# Patient Record
Sex: Female | Born: 1945 | Race: White | Hispanic: No | Marital: Married | Smoking: Never smoker
Health system: Southern US, Community
[De-identification: ages and names within clinical notes are randomized; demographics above are authoritative.]

## PROBLEM LIST (undated history)

## (undated) DIAGNOSIS — N952 Postmenopausal atrophic vaginitis: Secondary | ICD-10-CM

## (undated) DIAGNOSIS — C801 Malignant (primary) neoplasm, unspecified: Secondary | ICD-10-CM

## (undated) DIAGNOSIS — I3139 Other pericardial effusion (noninflammatory): Secondary | ICD-10-CM

## (undated) DIAGNOSIS — N816 Rectocele: Secondary | ICD-10-CM

## (undated) DIAGNOSIS — R32 Unspecified urinary incontinence: Secondary | ICD-10-CM

## (undated) DIAGNOSIS — Z85828 Personal history of other malignant neoplasm of skin: Secondary | ICD-10-CM

## (undated) DIAGNOSIS — M810 Age-related osteoporosis without current pathological fracture: Secondary | ICD-10-CM

## (undated) DIAGNOSIS — F329 Major depressive disorder, single episode, unspecified: Secondary | ICD-10-CM

## (undated) DIAGNOSIS — E039 Hypothyroidism, unspecified: Secondary | ICD-10-CM

## (undated) DIAGNOSIS — IMO0002 Reserved for concepts with insufficient information to code with codable children: Secondary | ICD-10-CM

## (undated) DIAGNOSIS — I10 Essential (primary) hypertension: Secondary | ICD-10-CM

## (undated) DIAGNOSIS — E559 Vitamin D deficiency, unspecified: Secondary | ICD-10-CM

## (undated) DIAGNOSIS — I313 Pericardial effusion (noninflammatory): Secondary | ICD-10-CM

## (undated) DIAGNOSIS — N761 Subacute and chronic vaginitis: Secondary | ICD-10-CM

## (undated) DIAGNOSIS — E785 Hyperlipidemia, unspecified: Secondary | ICD-10-CM

## (undated) DIAGNOSIS — Z8619 Personal history of other infectious and parasitic diseases: Secondary | ICD-10-CM

## (undated) DIAGNOSIS — S22000A Wedge compression fracture of unspecified thoracic vertebra, initial encounter for closed fracture: Secondary | ICD-10-CM

## (undated) DIAGNOSIS — M1990 Unspecified osteoarthritis, unspecified site: Secondary | ICD-10-CM

## (undated) DIAGNOSIS — F32A Depression, unspecified: Secondary | ICD-10-CM

## (undated) HISTORY — DX: Personal history of other malignant neoplasm of skin: Z85.828

## (undated) HISTORY — PX: VAGINAL HYSTERECTOMY: SUR661

## (undated) HISTORY — DX: Hypothyroidism, unspecified: E03.9

## (undated) HISTORY — DX: Other pericardial effusion (noninflammatory): I31.39

## (undated) HISTORY — DX: Vitamin D deficiency, unspecified: E55.9

## (undated) HISTORY — DX: Reserved for concepts with insufficient information to code with codable children: IMO0002

## (undated) HISTORY — PX: EYE SURGERY: SHX253

## (undated) HISTORY — DX: Subacute and chronic vaginitis: N76.1

## (undated) HISTORY — DX: Age-related osteoporosis without current pathological fracture: M81.0

## (undated) HISTORY — DX: Depression, unspecified: F32.A

## (undated) HISTORY — DX: Wedge compression fracture of unspecified thoracic vertebra, initial encounter for closed fracture: S22.000A

## (undated) HISTORY — DX: Unspecified urinary incontinence: R32

## (undated) HISTORY — DX: Personal history of other infectious and parasitic diseases: Z86.19

## (undated) HISTORY — DX: Rectocele: N81.6

## (undated) HISTORY — PX: TONSILLECTOMY: SUR1361

## (undated) HISTORY — DX: Postmenopausal atrophic vaginitis: N95.2

## (undated) HISTORY — PX: APPENDECTOMY: SHX54

## (undated) HISTORY — DX: Major depressive disorder, single episode, unspecified: F32.9

## (undated) HISTORY — DX: Hyperlipidemia, unspecified: E78.5

## (undated) HISTORY — DX: Pericardial effusion (noninflammatory): I31.3

---

## 2001-10-06 HISTORY — PX: CHOLECYSTECTOMY: SHX55

## 2004-10-12 ENCOUNTER — Ambulatory Visit: Payer: Self-pay | Admitting: Internal Medicine

## 2005-01-23 ENCOUNTER — Ambulatory Visit: Payer: Self-pay | Admitting: Internal Medicine

## 2006-04-17 ENCOUNTER — Ambulatory Visit: Payer: Self-pay | Admitting: Internal Medicine

## 2006-05-18 ENCOUNTER — Ambulatory Visit: Payer: Self-pay | Admitting: Internal Medicine

## 2007-09-17 ENCOUNTER — Ambulatory Visit: Payer: Self-pay | Admitting: Internal Medicine

## 2008-02-18 ENCOUNTER — Ambulatory Visit: Payer: Self-pay | Admitting: Internal Medicine

## 2008-05-23 ENCOUNTER — Ambulatory Visit: Payer: Self-pay | Admitting: Internal Medicine

## 2009-02-28 ENCOUNTER — Ambulatory Visit: Payer: Self-pay | Admitting: Internal Medicine

## 2010-04-26 ENCOUNTER — Ambulatory Visit: Payer: Self-pay | Admitting: Internal Medicine

## 2011-02-05 ENCOUNTER — Ambulatory Visit: Payer: Self-pay | Admitting: Ophthalmology

## 2011-05-21 ENCOUNTER — Ambulatory Visit: Payer: Self-pay | Admitting: Ophthalmology

## 2011-10-17 ENCOUNTER — Ambulatory Visit: Payer: Self-pay | Admitting: Internal Medicine

## 2012-03-05 ENCOUNTER — Ambulatory Visit: Payer: Self-pay | Admitting: Internal Medicine

## 2012-11-26 ENCOUNTER — Ambulatory Visit: Payer: Self-pay | Admitting: Internal Medicine

## 2013-10-03 ENCOUNTER — Ambulatory Visit: Payer: Self-pay | Admitting: Internal Medicine

## 2013-12-05 ENCOUNTER — Ambulatory Visit: Payer: Self-pay | Admitting: Internal Medicine

## 2014-03-13 DIAGNOSIS — M810 Age-related osteoporosis without current pathological fracture: Secondary | ICD-10-CM | POA: Insufficient documentation

## 2014-03-13 DIAGNOSIS — E785 Hyperlipidemia, unspecified: Secondary | ICD-10-CM | POA: Insufficient documentation

## 2014-03-13 DIAGNOSIS — E871 Hypo-osmolality and hyponatremia: Secondary | ICD-10-CM | POA: Insufficient documentation

## 2014-03-13 DIAGNOSIS — K219 Gastro-esophageal reflux disease without esophagitis: Secondary | ICD-10-CM | POA: Insufficient documentation

## 2014-03-14 ENCOUNTER — Ambulatory Visit: Payer: Self-pay | Admitting: Internal Medicine

## 2014-06-28 ENCOUNTER — Ambulatory Visit: Payer: Self-pay | Admitting: Ophthalmology

## 2015-04-27 ENCOUNTER — Other Ambulatory Visit: Payer: Self-pay | Admitting: Internal Medicine

## 2015-04-27 DIAGNOSIS — Z1231 Encounter for screening mammogram for malignant neoplasm of breast: Secondary | ICD-10-CM

## 2015-04-30 ENCOUNTER — Other Ambulatory Visit: Payer: Self-pay | Admitting: Internal Medicine

## 2015-04-30 ENCOUNTER — Ambulatory Visit
Admission: RE | Admit: 2015-04-30 | Discharge: 2015-04-30 | Disposition: A | Discharge status: Home / Self Care | Payer: Medicare Other | Source: Ambulatory Visit | Attending: Internal Medicine | Admitting: Internal Medicine

## 2015-04-30 DIAGNOSIS — Z1231 Encounter for screening mammogram for malignant neoplasm of breast: Secondary | ICD-10-CM

## 2015-05-14 ENCOUNTER — Telehealth: Payer: Self-pay | Admitting: Obstetrics and Gynecology

## 2015-05-14 ENCOUNTER — Other Ambulatory Visit: Payer: Self-pay | Admitting: Obstetrics and Gynecology

## 2015-05-14 MED ORDER — ESTROGENS, CONJUGATED 0.625 MG/GM VA CREA: 1.0000 | TOPICAL_CREAM | VAGINAL | Status: DC

## 2015-05-14 NOTE — Telephone Encounter (Signed)
OK SO Meagan Cain DECIDED TO STAY ON DR DE SCHEDULE AND IS COMING 8/25 @ 730. SHE NEEDS A REFILL ON PREMARIN CREAM UNTIL THEN PLEASE.

## 2015-05-14 NOTE — Telephone Encounter (Signed)
Pt aware 1 tube erx.

## 2015-05-31 ENCOUNTER — Ambulatory Visit (INDEPENDENT_AMBULATORY_CARE_PROVIDER_SITE_OTHER): Payer: Medicare Other | Admitting: Obstetrics and Gynecology

## 2015-05-31 ENCOUNTER — Encounter: Payer: Self-pay | Admitting: Obstetrics and Gynecology

## 2015-05-31 VITALS — BP 156/81 | HR 73 | Ht 61.0 in | Wt 143.2 lb

## 2015-05-31 DIAGNOSIS — E038 Other specified hypothyroidism: Secondary | ICD-10-CM | POA: Diagnosis not present

## 2015-05-31 DIAGNOSIS — H6520 Chronic serous otitis media, unspecified ear: Secondary | ICD-10-CM

## 2015-05-31 DIAGNOSIS — Z9889 Other specified postprocedural states: Secondary | ICD-10-CM | POA: Diagnosis not present

## 2015-05-31 DIAGNOSIS — E559 Vitamin D deficiency, unspecified: Secondary | ICD-10-CM

## 2015-05-31 DIAGNOSIS — N762 Acute vulvitis: Secondary | ICD-10-CM | POA: Diagnosis not present

## 2015-05-31 DIAGNOSIS — Z90710 Acquired absence of both cervix and uterus: Secondary | ICD-10-CM | POA: Diagnosis not present

## 2015-05-31 DIAGNOSIS — E079 Disorder of thyroid, unspecified: Secondary | ICD-10-CM | POA: Insufficient documentation

## 2015-05-31 DIAGNOSIS — IMO0002 Reserved for concepts with insufficient information to code with codable children: Secondary | ICD-10-CM

## 2015-05-31 DIAGNOSIS — Z85828 Personal history of other malignant neoplasm of skin: Secondary | ICD-10-CM

## 2015-05-31 DIAGNOSIS — S22000A Wedge compression fracture of unspecified thoracic vertebra, initial encounter for closed fracture: Secondary | ICD-10-CM | POA: Insufficient documentation

## 2015-05-31 DIAGNOSIS — N8111 Cystocele, midline: Secondary | ICD-10-CM | POA: Insufficient documentation

## 2015-05-31 DIAGNOSIS — N816 Rectocele: Secondary | ICD-10-CM

## 2015-05-31 DIAGNOSIS — S22000S Wedge compression fracture of unspecified thoracic vertebra, sequela: Secondary | ICD-10-CM

## 2015-05-31 DIAGNOSIS — H6690 Otitis media, unspecified, unspecified ear: Secondary | ICD-10-CM | POA: Insufficient documentation

## 2015-05-31 DIAGNOSIS — N8110 Cystocele, unspecified: Secondary | ICD-10-CM

## 2015-05-31 DIAGNOSIS — E039 Hypothyroidism, unspecified: Secondary | ICD-10-CM | POA: Insufficient documentation

## 2015-05-31 DIAGNOSIS — I10 Essential (primary) hypertension: Secondary | ICD-10-CM | POA: Insufficient documentation

## 2015-05-31 DIAGNOSIS — Z9049 Acquired absence of other specified parts of digestive tract: Secondary | ICD-10-CM | POA: Insufficient documentation

## 2015-05-31 DIAGNOSIS — F329 Major depressive disorder, single episode, unspecified: Secondary | ICD-10-CM | POA: Insufficient documentation

## 2015-05-31 DIAGNOSIS — Z8619 Personal history of other infectious and parasitic diseases: Secondary | ICD-10-CM

## 2015-05-31 DIAGNOSIS — K469 Unspecified abdominal hernia without obstruction or gangrene: Secondary | ICD-10-CM | POA: Diagnosis not present

## 2015-05-31 DIAGNOSIS — F32A Depression, unspecified: Secondary | ICD-10-CM | POA: Insufficient documentation

## 2015-05-31 DIAGNOSIS — N3946 Mixed incontinence: Secondary | ICD-10-CM | POA: Diagnosis not present

## 2015-05-31 DIAGNOSIS — R32 Unspecified urinary incontinence: Secondary | ICD-10-CM | POA: Insufficient documentation

## 2015-05-31 MED ORDER — NYSTATIN 100000 UNIT/GM EX OINT: 1.0000 "application " | TOPICAL_OINTMENT | Freq: Two times a day (BID) | CUTANEOUS | Status: DC

## 2015-05-31 MED ORDER — TRIAMCINOLONE ACETONIDE 0.1 % EX OINT: 1.0000 "application " | TOPICAL_OINTMENT | Freq: Two times a day (BID) | CUTANEOUS | Status: DC

## 2015-05-31 NOTE — Patient Instructions (Addendum)
1.  Continue Premarin cream intravaginally Twice weekly. 2.  Use the nystatin/triamcinolone Ointment twice a day for 2 weeks, apply topically to the vulva. 3.  Return in 2 weeks for pessary trial.

## 2015-05-31 NOTE — Progress Notes (Signed)
Chief complaint: 1.  Cystocele. 2.  Rectocele 3.  Difficulty voiding. 4.  External vulvar irritation.  The patient is a 69 year old female with known second to third-degree cystocele and mild rectocele, presents for interval follow-up.  She has not been seen in several years.  Patient is using Premarin cream intravaginally twice weekly. Most recently, patient has used Lotrisone cream externally for vulvar irritation.  Past Medical History  Diagnosis Date  . Thoracic compression fracture   . Effusion, pericardium   . Depression   . Vitamin D deficiency   . Hyperlipemia   . Hypothyroidism   . History of shingles   . History of skin cancer   . Osteoporosis   . Chronic vaginitis   . Urinary incontinence   . Vaginal atrophy   . Rectocele   . Cystocele    Past Surgical History  Procedure Laterality Date  . Cholecystectomy  2003  . Vaginal hysterectomy    . Appendectomy     Review of Systems  Constitutional: Negative.   HENT: Negative.   Respiratory: Negative.   Cardiovascular: Negative.   Gastrointestinal: Negative.   Genitourinary: Negative.   Musculoskeletal: Negative.   Skin: Positive for itching.       Vulvar itching and burning.  Improves with Lotrisone therapy intermittently  Endo/Heme/Allergies: Negative.    OBJECTIVE: BP 156/81 mmHg  Pulse 73  Ht 5\' 1"  (1.549 m)  Wt 143 lb 3 oz (64.949 kg)  BMI 27.07 kg/m2 Pleasant, well-appearing white female in no acute distress. Abdomen: Soft, nontender, without organomegaly. Pelvic exam: External genitalia-bilateral labia majora, erythema, without epithelial skin breakdown or ulceration; few possible satellite lesions BUS-normal. Vagina-third degree cystocele; vaginal mucosa with fair estrogen effect; enterocele present; mild rectocele. Cervix-surgically absent. Uterus-surgically absent Adnexa-nontender, nonpalpable. Rectovaginal-external exam with mild hyperemia; normal sphincter tone,; no rectal masses. Extremities:  Without clubbing, cyanosis or edema.  IMPRESSION: 1.  Third-degree cystocele with occasional difficulty emptying bladder when full; mild incontinence, intermittent. 2.  Mild rectocele, Minimally symptomatic. 3.  Enterocele. 4.  Chronic vulvitis incompletely clearing with Lotrisone therapy; possible monilia. 5.  Voiding difficulties.  PLAN: 1.  Options of management are reviewed regarding pelvic organ prolapse.  Conservative therapy with pessary versus definitive surgery with anterior, posterior colporrhaphy with enterocele ligation reviewed. 2.  Continue with Premarin cream, intravaginal biweekly. 3.  Nystatin and triamcinolone ointment to be applied topically twice a day for 14 days. 4.  Return in 2 weeks for follow-up on vulvitis and for a pessary trial.

## 2015-06-04 ENCOUNTER — Other Ambulatory Visit: Payer: Self-pay | Admitting: Internal Medicine

## 2015-06-04 DIAGNOSIS — M546 Pain in thoracic spine: Secondary | ICD-10-CM

## 2015-06-08 ENCOUNTER — Ambulatory Visit
Admission: RE | Admit: 2015-06-08 | Discharge: 2015-06-08 | Disposition: A | Discharge status: Home / Self Care | Payer: Medicare Other | Source: Ambulatory Visit | Attending: Internal Medicine | Admitting: Internal Medicine

## 2015-06-08 DIAGNOSIS — M546 Pain in thoracic spine: Secondary | ICD-10-CM

## 2015-06-08 DIAGNOSIS — G8929 Other chronic pain: Secondary | ICD-10-CM | POA: Insufficient documentation

## 2015-06-08 DIAGNOSIS — M4854XA Collapsed vertebra, not elsewhere classified, thoracic region, initial encounter for fracture: Secondary | ICD-10-CM | POA: Insufficient documentation

## 2015-06-08 DIAGNOSIS — M47814 Spondylosis without myelopathy or radiculopathy, thoracic region: Secondary | ICD-10-CM | POA: Diagnosis not present

## 2015-06-20 ENCOUNTER — Encounter: Payer: Self-pay | Admitting: Obstetrics and Gynecology

## 2015-06-20 ENCOUNTER — Ambulatory Visit (INDEPENDENT_AMBULATORY_CARE_PROVIDER_SITE_OTHER): Payer: Medicare Other | Admitting: Obstetrics and Gynecology

## 2015-06-20 VITALS — BP 139/78 | HR 75 | Ht 61.0 in | Wt 147.3 lb

## 2015-06-20 DIAGNOSIS — IMO0002 Reserved for concepts with insufficient information to code with codable children: Secondary | ICD-10-CM

## 2015-06-20 DIAGNOSIS — K469 Unspecified abdominal hernia without obstruction or gangrene: Secondary | ICD-10-CM | POA: Diagnosis not present

## 2015-06-20 DIAGNOSIS — N8110 Cystocele, unspecified: Secondary | ICD-10-CM | POA: Diagnosis not present

## 2015-06-20 DIAGNOSIS — N816 Rectocele: Secondary | ICD-10-CM | POA: Diagnosis not present

## 2015-06-20 DIAGNOSIS — Z90710 Acquired absence of both cervix and uterus: Secondary | ICD-10-CM | POA: Diagnosis not present

## 2015-06-20 NOTE — Patient Instructions (Signed)
1.  Pessaries are not effective in managing symptoms. 2.  Continue with vaginal estrogen therapy twice a week. 3.  Return for preoperative appointment for scheduled anterior/posterior colporrhaphy with enterocele ligation.

## 2015-06-20 NOTE — Progress Notes (Signed)
Patient ID: Meagan Cain, female   DOB: 11/13/1945, 69 y.o.   MRN: [id number] Pessary fitting  Chief complaint: 1.  Cystocele. 2.  Rectocele. 3.  Enterocele. 4.  Mixed incontinence.  Patient presents today for pessary fitting.  Multiple pessaries were attempted including: 1. Ring with support 2.  Incontinence ring 3.  Doughnut 4.  GellHorn-declined  After discussing options of management including a pessary, which would prevent intimacy, the patient has decided to proceed with surgical repair.  She understands that the repair is time-limited to 7-10 years, and that there is a possibility of developing PostoperativeStress incontinence.  The patient is willing to proceed with surgery and is accepting of risks.  She will return for a preoperative appointment prior to the surgery.  IMPRESSION: 1.  Symptomatic pelvic relaxation (third-degree cystocele, moderate rectocele, enterocele). 2.  Unsuccessful pessary fitting.  PLAN: 1.  Scheduled anterior/posterior colporrhaphy with enterocele ligation. 2.  Return for preoperative appointment.  A total of 25 minutes were spent face-to-face with the patient during this encounter and over half of that time involved counseling and coordination of care.  Brayton Mars, MD

## 2015-07-09 DIAGNOSIS — M79673 Pain in unspecified foot: Secondary | ICD-10-CM

## 2015-07-24 ENCOUNTER — Encounter: Payer: Self-pay | Admitting: Obstetrics and Gynecology

## 2015-07-24 ENCOUNTER — Ambulatory Visit (INDEPENDENT_AMBULATORY_CARE_PROVIDER_SITE_OTHER): Payer: Medicare Other | Admitting: Obstetrics and Gynecology

## 2015-07-24 ENCOUNTER — Encounter
Admission: RE | Admit: 2015-07-24 | Discharge: 2015-07-24 | Disposition: A | Discharge status: Home / Self Care | Payer: Medicare Other | Source: Ambulatory Visit | Attending: Obstetrics and Gynecology | Admitting: Obstetrics and Gynecology

## 2015-07-24 VITALS — BP 130/78 | HR 89 | Ht 61.0 in | Wt 143.0 lb

## 2015-07-24 DIAGNOSIS — Z90710 Acquired absence of both cervix and uterus: Secondary | ICD-10-CM

## 2015-07-24 DIAGNOSIS — K469 Unspecified abdominal hernia without obstruction or gangrene: Secondary | ICD-10-CM | POA: Diagnosis not present

## 2015-07-24 DIAGNOSIS — N816 Rectocele: Secondary | ICD-10-CM

## 2015-07-24 DIAGNOSIS — N8110 Cystocele, unspecified: Secondary | ICD-10-CM | POA: Diagnosis not present

## 2015-07-24 DIAGNOSIS — Z01818 Encounter for other preprocedural examination: Secondary | ICD-10-CM | POA: Diagnosis not present

## 2015-07-24 DIAGNOSIS — IMO0002 Reserved for concepts with insufficient information to code with codable children: Secondary | ICD-10-CM

## 2015-07-24 HISTORY — DX: Essential (primary) hypertension: I10

## 2015-07-24 HISTORY — DX: Unspecified osteoarthritis, unspecified site: M19.90

## 2015-07-24 HISTORY — DX: Malignant (primary) neoplasm, unspecified: C80.1

## 2015-07-24 LAB — RAPID HIV SCREEN (HIV 1/2 AB+AG)
HIV 1/2 ANTIBODIES: NONREACTIVE
HIV-1 P24 ANTIGEN - HIV24: NONREACTIVE

## 2015-07-24 LAB — CBC WITH DIFFERENTIAL/PLATELET
BASOS ABS: 0.1 10*3/uL (ref 0–0.1)
BASOS PCT: 1 %
EOS ABS: 0.1 10*3/uL (ref 0–0.7)
EOS PCT: 3 %
HCT: 37.1 % (ref 35.0–47.0)
Hemoglobin: 12.4 g/dL (ref 12.0–16.0)
Lymphocytes Relative: 37 %
Lymphs Abs: 2.1 10*3/uL (ref 1.0–3.6)
MCH: 29 pg (ref 26.0–34.0)
MCHC: 33.5 g/dL (ref 32.0–36.0)
MCV: 86.6 fL (ref 80.0–100.0)
Monocytes Absolute: 0.6 10*3/uL (ref 0.2–0.9)
Monocytes Relative: 11 %
NEUTROS PCT: 50 %
Neutro Abs: 2.9 10*3/uL (ref 1.4–6.5)
PLATELETS: 343 10*3/uL (ref 150–440)
RBC: 4.28 MIL/uL (ref 3.80–5.20)
RDW: 14.2 % (ref 11.5–14.5)
WBC: 5.8 10*3/uL (ref 3.6–11.0)

## 2015-07-24 LAB — TYPE AND SCREEN
ABO/RH(D): B NEG
Antibody Screen: NEGATIVE

## 2015-07-24 LAB — ABO/RH: ABO/RH(D): B NEG

## 2015-07-24 NOTE — Patient Instructions (Signed)
  Your procedure is scheduled on: July 30, 2015(Monday) Report to Day Surgery. To find out your arrival time please call [phone] between 1PM - 3PM on July 27, 2015 (Friday)  Remember: Instructions that are not followed completely may result in serious medical risk, up to and including death, or upon the discretion of your surgeon and anesthesiologist your surgery may need to be rescheduled.    __x__ 1. Do not eat food or drink liquids after midnight. No gum chewing or hard candies.     ____ 2. No Alcohol for 24 hours before or after surgery.   ____ 3. Bring all medications with you on the day of surgery if instructed.    __x__ 4. Notify your doctor if there is any change in your medical condition     (cold, fever, infections).     Do not wear jewelry, make-up, hairpins, clips or nail polish.  Do not wear lotions, powders, or perfumes. You may wear deodorant.  Do not shave 48 hours prior to surgery. Men may shave face and neck.  Do not bring valuables to the hospital.    [facility] is not responsible for any belongings or valuables.               Contacts, dentures or bridgework may not be worn into surgery.  Leave your suitcase in the car. After surgery it may be brought to your room.  For patients admitted to the hospital, discharge time is determined by your                treatment team.   Patients discharged the day of surgery will not be allowed to drive home.   Please read over the following fact sheets that you were given:   Surgical Site Infection Prevention   ____ Take these medicines the morning of surgery with A SIP OF WATER:    1. Losartan  2.   3.   4.  5.  6.  ____ Fleet Enema (as directed)   _x___ Use CHG Soap as directed  ____ Use inhalers on the day of surgery  ____ Stop metformin 2 days prior to surgery    ____ Take 1/2 of usual insulin dose the night before surgery and none on the morning of surgery.   _x__ Stop Coumadin/Plavix/aspirin  on (Patient has stopped Aspirin)  _x___ Stop Anti-inflammatories on (Tylenol ok to take for pain)   _x___ Stop supplements until after surgery.  (Stop Vitamin B-12 now)  ____ Bring C-Pap to the hospital.

## 2015-07-24 NOTE — Progress Notes (Signed)
Patient ID: Meagan Cain, female   DOB: November 10, 1945, 69 y.o.   MRN: [id number]   Subjective:  PREOPERATIVE HISTORY AND PHYSICAL  Date of surgery: 07/30/2015 Chief complaint: 1.  Cystocele. 2.  Rectocele. 3.  Enterocele   Patient is a 69 y.o. G2P2017female scheduled for anterior/posterior colporrhaphy with enterocele ligation. Indications for procedure are cystocele/rectocele/enterocele. She is status post transvaginal hysterectomy.   Pertinent Gynecological History:    Menstrual History: OB History    Gravida Para Term Preterm AB TAB SAB Ectopic Multiple Living   2 2 2       2       Menarche age: NA No LMP recorded. Patient has had a hysterectomy.    Past Medical History  Diagnosis Date  . Thoracic compression fracture ([place])   . Effusion, pericardium   . Depression   . Vitamin D deficiency   . Hyperlipemia   . Hypothyroidism   . History of shingles   . History of skin cancer   . Osteoporosis   . Chronic vaginitis   . Urinary incontinence   . Vaginal atrophy   . Rectocele   . Cystocele     Past Surgical History  Procedure Laterality Date  . Cholecystectomy  2003  . Vaginal hysterectomy    . Appendectomy      OB History  Gravida Para Term Preterm AB SAB TAB Ectopic Multiple Living  2 2 2       2     # Outcome Date GA Lbr Len/2nd Weight Sex Delivery Anes PTL Lv  2 Term 04/1970    [place]  1 Term 07/1963    F Vag-Spont   Y      Social History   Social History  . Marital Status: Married    Spouse Name: N/A  . Number of Children: N/A  . Years of Education: N/A   Social History Main Topics  . Smoking status: Never Smoker   . Smokeless tobacco: Never Used  . Alcohol Use: No  . Drug Use: No  . Sexual Activity:    Partners: Female    Patent examiner Protection: Surgical   Other Topics Concern  . None   Social History Narrative    Family History  Problem Relation Age of Onset  . Throat cancer Maternal Grandmother   . Heart disease  Mother   . Diabetes Mother   . Heart disease Father   . Heart disease Brother   . Diabetes Brother   . Cancer Neg Hx      (Not in a hospital admission)  Allergies  Allergen Reactions  . Cephalosporins Nausea And Vomiting  . Ibandronic Acid Nausea And Vomiting  . Sulfa Antibiotics Rash    Review of Systems Constitutional: No recent fever/chills/sweats Respiratory: No recent cough/bronchitis Cardiovascular: No chest pain Gastrointestinal: No recent nausea/vomiting/diarrhea Genitourinary: No UTI symptoms Hematologic/lymphatic:No history of coagulopathy or recent blood thinner use    Objective:    BP 130/78 mmHg  Pulse 89  Ht 5\' 1"  (1.549 m)  Wt 143 lb (64.864 kg)  BMI 27.03 kg/m2  General:   Normal  Skin:   normal  HEENT:  Normal  Neck:  Supple without Adenopathy or Thyromegaly  Lungs:   Heart:              Breasts:   Abdomen:  Pelvis:  M/S   Extremeties:  Neuro:    clear to auscultation bilaterally   Normal without murmur   Not Examined  soft, non-tender; bowel sounds normal; no masses,  no organomegaly   Exam deferred to OR  No CVAT  Warm/Dry   Normal          Assessment:   1.  Cystocele. 2.  Rectocele. 3.  Enterocele   Plan:   Anterior/posterior colporrhaphy with enterocele ligation.  Preoperative counseling: The patient is to undergo anterior/posterior colporrhaphy with enterocele ligation for symptomatic pelvic organ prolapse.  She is understanding of the planned procedure and is aware of and is accepting of all surgical risks which include but are not limited to bleeding, infection, pelvic organ injury with need for repair, blood clots disorders, anesthesia risks, etc.  All questions are answered.  Informed consent is given.  Patient is ready and willing to proceed with surgery as scheduled.

## 2015-07-24 NOTE — Patient Instructions (Signed)
1.  Return in 1 week for postop check 

## 2015-07-24 NOTE — OR Nursing (Signed)
As instructed by Dr Danice Goltz requesting clearance called and faxed to Dr Doy Hutching. Spoke with Marlowe Kays. Faxed and called to Dr Enzo Bi and spoke to [place]

## 2015-07-25 LAB — RPR: RPR: NONREACTIVE

## 2015-07-25 NOTE — OR Nursing (Signed)
Cleared by Dr Doy Hutching low risk 07/24/15

## 2015-07-30 ENCOUNTER — Encounter
Admission: AD | Disposition: A | Discharge status: Home / Self Care | Payer: Self-pay | Source: Ambulatory Visit | Attending: Obstetrics and Gynecology

## 2015-07-30 ENCOUNTER — Ambulatory Visit: Payer: Medicare Other | Admitting: Anesthesiology

## 2015-07-30 ENCOUNTER — Observation Stay
Admission: AD | Admit: 2015-07-30 | Discharge: 2015-07-31 | Disposition: A | Discharge status: Home / Self Care | Payer: Medicare Other | Source: Ambulatory Visit | Attending: Obstetrics and Gynecology | Admitting: Obstetrics and Gynecology

## 2015-07-30 DIAGNOSIS — Z833 Family history of diabetes mellitus: Secondary | ICD-10-CM | POA: Insufficient documentation

## 2015-07-30 DIAGNOSIS — Z881 Allergy status to other antibiotic agents status: Secondary | ICD-10-CM | POA: Insufficient documentation

## 2015-07-30 DIAGNOSIS — R339 Retention of urine, unspecified: Secondary | ICD-10-CM | POA: Diagnosis not present

## 2015-07-30 DIAGNOSIS — Z888 Allergy status to other drugs, medicaments and biological substances status: Secondary | ICD-10-CM | POA: Insufficient documentation

## 2015-07-30 DIAGNOSIS — Z8249 Family history of ischemic heart disease and other diseases of the circulatory system: Secondary | ICD-10-CM | POA: Diagnosis not present

## 2015-07-30 DIAGNOSIS — M4854XS Collapsed vertebra, not elsewhere classified, thoracic region, sequela of fracture: Secondary | ICD-10-CM | POA: Diagnosis not present

## 2015-07-30 DIAGNOSIS — E785 Hyperlipidemia, unspecified: Secondary | ICD-10-CM | POA: Diagnosis not present

## 2015-07-30 DIAGNOSIS — Z882 Allergy status to sulfonamides status: Secondary | ICD-10-CM | POA: Diagnosis not present

## 2015-07-30 DIAGNOSIS — Z800 Family history of malignant neoplasm of digestive organs: Secondary | ICD-10-CM | POA: Diagnosis not present

## 2015-07-30 DIAGNOSIS — IMO0002 Reserved for concepts with insufficient information to code with codable children: Secondary | ICD-10-CM

## 2015-07-30 DIAGNOSIS — N761 Subacute and chronic vaginitis: Secondary | ICD-10-CM | POA: Diagnosis not present

## 2015-07-30 DIAGNOSIS — Z85828 Personal history of other malignant neoplasm of skin: Secondary | ICD-10-CM | POA: Diagnosis not present

## 2015-07-30 DIAGNOSIS — R32 Unspecified urinary incontinence: Secondary | ICD-10-CM | POA: Diagnosis not present

## 2015-07-30 DIAGNOSIS — F329 Major depressive disorder, single episode, unspecified: Secondary | ICD-10-CM | POA: Insufficient documentation

## 2015-07-30 DIAGNOSIS — N816 Rectocele: Secondary | ICD-10-CM | POA: Insufficient documentation

## 2015-07-30 DIAGNOSIS — N8111 Cystocele, midline: Secondary | ICD-10-CM | POA: Diagnosis not present

## 2015-07-30 DIAGNOSIS — E559 Vitamin D deficiency, unspecified: Secondary | ICD-10-CM | POA: Insufficient documentation

## 2015-07-30 DIAGNOSIS — Z90710 Acquired absence of both cervix and uterus: Secondary | ICD-10-CM | POA: Diagnosis not present

## 2015-07-30 DIAGNOSIS — E039 Hypothyroidism, unspecified: Secondary | ICD-10-CM | POA: Diagnosis not present

## 2015-07-30 DIAGNOSIS — M810 Age-related osteoporosis without current pathological fracture: Secondary | ICD-10-CM | POA: Insufficient documentation

## 2015-07-30 DIAGNOSIS — N8110 Cystocele, unspecified: Secondary | ICD-10-CM | POA: Diagnosis present

## 2015-07-30 HISTORY — PX: CYSTOCELE REPAIR: SHX163

## 2015-07-30 SURGERY — COLPORRHAPHY, ANTERIOR, FOR CYSTOCELE REPAIR
Anesthesia: General | Wound class: Clean Contaminated

## 2015-07-30 MED ORDER — DEXAMETHASONE SODIUM PHOSPHATE 4 MG/ML IJ SOLN
INTRAMUSCULAR | Status: DC | PRN
  Administered 2015-07-30: 5 mg via INTRAVENOUS

## 2015-07-30 MED ORDER — OXYCODONE HCL 5 MG/5ML PO SOLN: 5.0000 mg | Freq: Once | ORAL | Status: DC | PRN

## 2015-07-30 MED ORDER — ACETAMINOPHEN 325 MG PO TABS: 650.0000 mg | ORAL_TABLET | ORAL | Status: DC | PRN

## 2015-07-30 MED ORDER — FAMOTIDINE 20 MG PO TABS
20.0000 mg | ORAL_TABLET | Freq: Once | ORAL | Status: AC
  Administered 2015-07-30: 20 mg via ORAL

## 2015-07-30 MED ORDER — FAMOTIDINE 20 MG PO TABS
ORAL_TABLET | ORAL | Status: AC
  Filled 2015-07-30: qty 1

## 2015-07-30 MED ORDER — ACETAMINOPHEN 10 MG/ML IV SOLN
INTRAVENOUS | Status: DC | PRN
  Administered 2015-07-30: 1000 mg via INTRAVENOUS

## 2015-07-30 MED ORDER — SIMETHICONE 80 MG PO CHEW
80.0000 mg | CHEWABLE_TABLET | Freq: Four times a day (QID) | ORAL | Status: DC | PRN
  Administered 2015-07-30: 80 mg via ORAL
  Filled 2015-07-30: qty 1

## 2015-07-30 MED ORDER — DOCUSATE SODIUM 100 MG PO CAPS
100.0000 mg | ORAL_CAPSULE | Freq: Two times a day (BID) | ORAL | Status: DC
  Administered 2015-07-30 – 2015-07-31 (×3): 100 mg via ORAL
  Filled 2015-07-30 (×3): qty 1

## 2015-07-30 MED ORDER — SUCCINYLCHOLINE CHLORIDE 20 MG/ML IJ SOLN
INTRAMUSCULAR | Status: DC | PRN
  Administered 2015-07-30: 100 mg via INTRAVENOUS

## 2015-07-30 MED ORDER — PHENYLEPHRINE HCL 10 % OP SOLN
OPHTHALMIC | Status: AC
  Filled 2015-07-30: qty 5

## 2015-07-30 MED ORDER — ESTROGENS, CONJUGATED 0.625 MG/GM VA CREA
TOPICAL_CREAM | VAGINAL | Status: AC
  Filled 2015-07-30: qty 30

## 2015-07-30 MED ORDER — ACETAMINOPHEN 10 MG/ML IV SOLN
INTRAVENOUS | Status: AC
  Filled 2015-07-30: qty 100

## 2015-07-30 MED ORDER — OXYCODONE-ACETAMINOPHEN 5-325 MG PO TABS: 1.0000 | ORAL_TABLET | ORAL | Status: DC | PRN

## 2015-07-30 MED ORDER — MORPHINE SULFATE (PF) 2 MG/ML IV SOLN: 1.0000 mg | INTRAVENOUS | Status: DC | PRN

## 2015-07-30 MED ORDER — FENTANYL CITRATE (PF) 100 MCG/2ML IJ SOLN
25.0000 ug | INTRAMUSCULAR | Status: AC | PRN
  Administered 2015-07-30 (×6): 25 ug via INTRAVENOUS

## 2015-07-30 MED ORDER — MIDAZOLAM HCL 2 MG/2ML IJ SOLN
INTRAMUSCULAR | Status: DC | PRN
  Administered 2015-07-30: 2 mg via INTRAVENOUS

## 2015-07-30 MED ORDER — LIDOCAINE HCL (CARDIAC) 20 MG/ML IV SOLN
INTRAVENOUS | Status: DC | PRN
  Administered 2015-07-30: 50 mg via INTRAVENOUS

## 2015-07-30 MED ORDER — FENTANYL CITRATE (PF) 100 MCG/2ML IJ SOLN
INTRAMUSCULAR | Status: AC
  Administered 2015-07-30: 25 ug via INTRAVENOUS
  Filled 2015-07-30: qty 2

## 2015-07-30 MED ORDER — BISACODYL 10 MG RE SUPP: 10.0000 mg | Freq: Every day | RECTAL | Status: DC | PRN

## 2015-07-30 MED ORDER — PROPOFOL 10 MG/ML IV BOLUS
INTRAVENOUS | Status: DC | PRN
  Administered 2015-07-30: 130 mg via INTRAVENOUS

## 2015-07-30 MED ORDER — FENTANYL CITRATE (PF) 100 MCG/2ML IJ SOLN
INTRAMUSCULAR | Status: DC | PRN
  Administered 2015-07-30: 150 ug via INTRAVENOUS
  Administered 2015-07-30 (×2): 50 ug via INTRAVENOUS

## 2015-07-30 MED ORDER — MOXIFLOXACIN HCL 0.5 % OP SOLN
OPHTHALMIC | Status: AC
  Filled 2015-07-30: qty 3

## 2015-07-30 MED ORDER — ONDANSETRON HCL 4 MG/2ML IJ SOLN
INTRAMUSCULAR | Status: DC | PRN
  Administered 2015-07-30: 4 mg via INTRAVENOUS

## 2015-07-30 MED ORDER — KETOROLAC TROMETHAMINE 30 MG/ML IJ SOLN
30.0000 mg | Freq: Four times a day (QID) | INTRAMUSCULAR | Status: DC
  Administered 2015-07-30 – 2015-07-31 (×5): 30 mg via INTRAVENOUS
  Filled 2015-07-30 (×5): qty 1

## 2015-07-30 MED ORDER — KETOROLAC TROMETHAMINE 30 MG/ML IJ SOLN: 30.0000 mg | Freq: Four times a day (QID) | INTRAMUSCULAR | Status: DC

## 2015-07-30 MED ORDER — LACTATED RINGERS IV SOLN
INTRAVENOUS | Status: DC
  Administered 2015-07-30 – 2015-07-31 (×3): via INTRAVENOUS

## 2015-07-30 MED ORDER — CYCLOPENTOLATE HCL 2 % OP SOLN
OPHTHALMIC | Status: AC
  Filled 2015-07-30: qty 2

## 2015-07-30 MED ORDER — OXYCODONE HCL 5 MG PO TABS: 5.0000 mg | ORAL_TABLET | Freq: Once | ORAL | Status: DC | PRN

## 2015-07-30 MED ORDER — EPHEDRINE SULFATE 50 MG/ML IJ SOLN
INTRAMUSCULAR | Status: DC | PRN
  Administered 2015-07-30: 10 mg via INTRAVENOUS

## 2015-07-30 MED ORDER — LACTATED RINGERS IV SOLN
INTRAVENOUS | Status: DC
  Administered 2015-07-30: 06:00:00 via INTRAVENOUS

## 2015-07-30 SURGICAL SUPPLY — 26 items
BAG URO DRAIN 2000ML W/SPOUT (MISCELLANEOUS) ×3 IMPLANT
CANISTER SUCT 1200ML W/VALVE (MISCELLANEOUS) ×3 IMPLANT
CATH FOLEY 2WAY  5CC 16FR (CATHETERS) ×1
CATH URTH 16FR FL 2W BLN LF (CATHETERS) ×2 IMPLANT
DRAPE PERI LITHO V/GYN (MISCELLANEOUS) ×3 IMPLANT
DRAPE SHEET LG 3/4 BI-LAMINATE (DRAPES) ×3 IMPLANT
DRAPE UNDER BUTTOCK W/FLU (DRAPES) ×3 IMPLANT
GAUZE PACK 2X3YD (MISCELLANEOUS) ×3 IMPLANT
GLOVE BIO SURGEON STRL SZ8 (GLOVE) ×18 IMPLANT
GLOVE INDICATOR 8.0 STRL GRN (GLOVE) ×18 IMPLANT
GOWN STRL REUS W/ TWL LRG LVL3 (GOWN DISPOSABLE) ×4 IMPLANT
GOWN STRL REUS W/ TWL XL LVL3 (GOWN DISPOSABLE) ×4 IMPLANT
GOWN STRL REUS W/TWL LRG LVL3 (GOWN DISPOSABLE) ×2
GOWN STRL REUS W/TWL XL LVL3 (GOWN DISPOSABLE) ×2
KIT RM TURNOVER CYSTO AR (KITS) ×3 IMPLANT
LABEL OR SOLS (LABEL) ×3 IMPLANT
NS IRRIG 500ML POUR BTL (IV SOLUTION) ×3 IMPLANT
PACK BASIN MINOR ARMC (MISCELLANEOUS) ×3 IMPLANT
PAD GROUND ADULT SPLIT (MISCELLANEOUS) ×3 IMPLANT
PAD PREP 24X41 OB/GYN DISP (PERSONAL CARE ITEMS) ×3 IMPLANT
SPONGE XRAY 4X4 16PLY STRL (MISCELLANEOUS) ×3 IMPLANT
SUT CHROMIC 2 0 CT 1 (SUTURE) ×12 IMPLANT
SUT VIC AB 0 CT1 36 (SUTURE) ×6 IMPLANT
SUT VIC AB 0 CT2 27 (SUTURE) ×6 IMPLANT
SUT VIC AB 2-0 UR6 27 (SUTURE) ×3 IMPLANT
SYRINGE 10CC LL (SYRINGE) ×3 IMPLANT

## 2015-07-30 NOTE — Anesthesia Postprocedure Evaluation (Signed)
  Anesthesia Post-op Note  Patient: Meagan Cain  Procedure(s) Performed: Procedure(s): ANTERIOR REPAIR (CYSTOCELE)  Anesthesia type:General ETT  Patient location: PACU  Post pain: Pain level controlled  Post assessment: Post-op Vital signs reviewed, Patient's Cardiovascular Status Stable, Respiratory Function Stable, Patent Airway and No signs of Nausea or vomiting  Post vital signs: Reviewed and stable  Last Vitals:  Filed Vitals:   07/30/15 1012  BP:   Pulse: 64  Temp:   Resp: 6    Level of consciousness: awake, alert  and patient cooperative  Complications: No apparent anesthesia complications

## 2015-07-30 NOTE — Op Note (Signed)
OPERATIVE NOTE:  Meagan Cain PROCEDURE DATE: 07/30/2015   PREOPERATIVE DIAGNOSIS:   1. Cystocele, second third degree 2. Rectocele, mild 3. Enterocele POSTOPERATIVE DIAGNOSIS:  1. Cystocele, third-degree PROCEDURE: Anterior colporrhaphy  SURGEON:  Brayton Mars, MD ASSISTANTS: Dr. Marcelline Mates and PA-S Hubert Azure ANESTHESIA: General INDICATIONS: 69 y.o. [id number] status post hysterectomy in past, presents for surgical management and hematocrit pelvic organ prolapse. Patient has taken to third-degree cystocele which is symptomatic in form of chronic urinary incontinence and incomplete bladder emptying. Patient has mild rectocele which is minimally symptomatic. Patient may also have enterocele.  FINDINGS:  Third-degree cystocele; no significant enterocele; mild rectocele. Because patient's GI symptomatology is minimal, rectus repair was deferred.   I/O's: Total I/O In: 800 [I.V.:800] Out: 650 [Urine:600; Blood:50] COUNTS:  YES SPECIMENS: None ANTIBIOTIC PROPHYLAXIS:N/A COMPLICATIONS: None immediate  PROCEDURE IN DETAIL: Patient was brought to the operating room versus placed in supine position. General endotracheal anesthesia was induced difficulty. She was placed in the dorsal lithotomy position using candycane stirrups. A Betadine perineal intravaginal prep and drape was performed in standard fashion. A Foley catheter was placed and was draining clear yellow urine. Foley catheter was clamped. The patient had anterior colporrhaphy then performed in standard fashion. Allis clamps were used to clamp the lateral margins of the vaginal mucosa at the apex of the vagina. Transverse incision was made through the vaginal mucosa. The vaginal mucosa was undermined with Metzenbaum scissors in the midline. Geraldine Contras retractors were used to facilitate exposure. This procedure was carried out within several centimeters of the urethral meatus. Vaginal mucosa was dissected off the perivesical fascia  through sharp and blunt dissection. Once the bladder was adequately mobilized vertical mattress sutures were placed in the perivesical fascia and vagina to reduce the once adequately reduced the excess vaginal mucosa was trimmed vagina was reapproximated midline using no enterocele was identified. Following the anterior colporrhaphy the vaginal vault was well supported. The rectocele repair was deferred because patient had minimal symptomatology and under anesthesia defect was minimal. Vagina was packed with Kerlex with Premarin cream. The patient was then mobilized and taken to the recovery room in satisfactory condition. Estimated blood loss was 50 mL. IV fluids were 700 mL. Urine output was 500 mL of clear urine.  Elyjah Hazan A. Zipporah Plants, MD, ACOG [facility]

## 2015-07-30 NOTE — Anesthesia Preprocedure Evaluation (Signed)
Anesthesia Evaluation  Patient identified by MRN, date of birth, ID band Patient awake    Reviewed: Allergy & Precautions, H&P , NPO status , Patient's Chart, lab work & pertinent test results  History of Anesthesia Complications Negative for: history of anesthetic complications  Airway Mallampati: II  TM Distance: <3 FB Neck ROM: limited    Dental  (+) Poor Dentition   Pulmonary neg pulmonary ROS, neg shortness of breath, neg COPD,    Pulmonary exam normal breath sounds clear to auscultation       Cardiovascular Exercise Tolerance: Good hypertension, (-) angina(-) Past MI and (-) DOE Normal cardiovascular exam Rhythm:regular Rate:Normal     Neuro/Psych PSYCHIATRIC DISORDERS Depression negative neurological ROS     GI/Hepatic Neg liver ROS, GERD  Controlled,  Endo/Other  Hypothyroidism   Renal/GU negative Renal ROS  negative genitourinary   Musculoskeletal  (+) Arthritis ,   Abdominal   Peds  Hematology negative hematology ROS (+)   Anesthesia Other Findings Past Medical History:   Thoracic compression fracture ([facility])                          Effusion, pericardium                                        Depression                                                   Vitamin D deficiency                                         Hyperlipemia                                                 Hypothyroidism                                               History of shingles                                          History of skin cancer                                       Osteoporosis                                                 Chronic vaginitis  Urinary incontinence                                         Vaginal atrophy                                              Rectocele                                                    Cystocele                                                     Hypertension                                                 Arthritis                                                    Cancer ([place])                                                   Comment:skin cancer  Past Surgical History:   CHOLECYSTECTOMY                                  2003         VAGINAL HYSTERECTOMY                                          APPENDECTOMY                                                  TONSILLECTOMY                                                 EYE SURGERY                                     Bilateral                Comment:Cataract Extraction with IOL implants  BMI    Body Mass Index   27.03 kg/m 2      Reproductive/Obstetrics negative OB ROS                             Anesthesia Physical Anesthesia Plan  ASA: III  Anesthesia Plan: General ETT   Post-op Pain Management:    Induction:   Airway Management Planned:   Additional Equipment:   Intra-op Plan:   Post-operative Plan:   Informed Consent: I have reviewed the patients History and Physical, chart, labs and discussed the procedure including the risks, benefits and alternatives for the proposed anesthesia with the patient or authorized representative who has indicated his/her understanding and acceptance.   Dental Advisory Given  Plan Discussed with: Anesthesiologist, CRNA and Surgeon  Anesthesia Plan Comments:         Anesthesia Quick Evaluation

## 2015-07-30 NOTE — H&P (View-Only) (Signed)
Patient ID: Meagan Cain, female   DOB: 06-24-1946, 69 y.o.   MRN: [id number]   Subjective:  PREOPERATIVE HISTORY AND PHYSICAL  Date of surgery: 07/30/2015 Chief complaint: 1.  Cystocele. 2.  Rectocele. 3.  Enterocele   Patient is a 69 y.o. G2P2055female scheduled for anterior/posterior colporrhaphy with enterocele ligation. Indications for procedure are cystocele/rectocele/enterocele. She is status post transvaginal hysterectomy.   Pertinent Gynecological History:    Menstrual History: OB History    Gravida Para Term Preterm AB TAB SAB Ectopic Multiple Living   2 2 2       2       Menarche age: NA No LMP recorded. Patient has had a hysterectomy.    Past Medical History  Diagnosis Date  . Thoracic compression fracture ([place])   . Effusion, pericardium   . Depression   . Vitamin D deficiency   . Hyperlipemia   . Hypothyroidism   . History of shingles   . History of skin cancer   . Osteoporosis   . Chronic vaginitis   . Urinary incontinence   . Vaginal atrophy   . Rectocele   . Cystocele     Past Surgical History  Procedure Laterality Date  . Cholecystectomy  2003  . Vaginal hysterectomy    . Appendectomy      OB History  Gravida Para Term Preterm AB SAB TAB Ectopic Multiple Living  2 2 2       2     # Outcome Date GA Lbr Len/2nd Weight Sex Delivery Anes PTL Lv  2 Term 04/1970    [place]  1 Term 07/1963    F Vag-Spont   Y      Social History   Social History  . Marital Status: Married    Spouse Name: N/A  . Number of Children: N/A  . Years of Education: N/A   Social History Main Topics  . Smoking status: Never Smoker   . Smokeless tobacco: Never Used  . Alcohol Use: No  . Drug Use: No  . Sexual Activity:    Partners: Female    Patent examiner Protection: Surgical   Other Topics Concern  . None   Social History Narrative    Family History  Problem Relation Age of Onset  . Throat cancer Maternal Grandmother   . Heart disease  Mother   . Diabetes Mother   . Heart disease Father   . Heart disease Brother   . Diabetes Brother   . Cancer Neg Hx      (Not in a hospital admission)  Allergies  Allergen Reactions  . Cephalosporins Nausea And Vomiting  . Ibandronic Acid Nausea And Vomiting  . Sulfa Antibiotics Rash    Review of Systems Constitutional: No recent fever/chills/sweats Respiratory: No recent cough/bronchitis Cardiovascular: No chest pain Gastrointestinal: No recent nausea/vomiting/diarrhea Genitourinary: No UTI symptoms Hematologic/lymphatic:No history of coagulopathy or recent blood thinner use    Objective:    BP 130/78 mmHg  Pulse 89  Ht 5\' 1"  (1.549 m)  Wt 143 lb (64.864 kg)  BMI 27.03 kg/m2  General:   Normal  Skin:   normal  HEENT:  Normal  Neck:  Supple without Adenopathy or Thyromegaly  Lungs:   Heart:              Breasts:   Abdomen:  Pelvis:  M/S   Extremeties:  Neuro:    clear to auscultation bilaterally   Normal without murmur   Not Examined  soft, non-tender; bowel sounds normal; no masses,  no organomegaly   Exam deferred to OR  No CVAT  Warm/Dry   Normal          Assessment:   1.  Cystocele. 2.  Rectocele. 3.  Enterocele   Plan:   Anterior/posterior colporrhaphy with enterocele ligation.  Preoperative counseling: The patient is to undergo anterior/posterior colporrhaphy with enterocele ligation for symptomatic pelvic organ prolapse.  She is understanding of the planned procedure and is aware of and is accepting of all surgical risks which include but are not limited to bleeding, infection, pelvic organ injury with need for repair, blood clots disorders, anesthesia risks, etc.  All questions are answered.  Informed consent is given.  Patient is ready and willing to proceed with surgery as scheduled.

## 2015-07-30 NOTE — Interval H&P Note (Signed)
History and Physical Interval Note:  07/30/2015 7:28 AM  Meagan Cain  has presented today for surgery, with the diagnosis of CYSTOCELE, RECTOCELE, ENTEROCELE  The various methods of treatment have been discussed with the patient and family. After consideration of risks, benefits and other options for treatment, the patient has consented to  Procedure(s): ANTERIOR (CYSTOCELE) AND POSTERIOR REPAIR (RECTOCELE) (N/A) as a surgical intervention .  The patient's history has been reviewed, patient examined, no change in status, stable for surgery.  I have reviewed the patient's chart and labs.  Questions were answered to the patient's satisfaction.     Hassell Done A Alyvia Derk

## 2015-07-30 NOTE — Transfer of Care (Signed)
Immediate Anesthesia Transfer of Care Note  Patient: Meagan Cain  Procedure(s) Performed: Procedure(s): ANTERIOR (CYSTOCELE) AND POSTERIOR REPAIR (RECTOCELE) (N/A)  Patient Location: PACU  Anesthesia Type:General  Level of Consciousness: awake  Airway & Oxygen Therapy: Patient Spontanous Breathing and Patient connected to face mask oxygen  Post-op Assessment: Report given to RN  Post vital signs: Reviewed  Last Vitals:  Filed Vitals:   07/30/15 0908  BP: 128/71  Pulse: 80  Temp: 36.1 C  Resp: 18    Complications: No apparent anesthesia complications

## 2015-07-30 NOTE — Anesthesia Procedure Notes (Signed)
Procedure Name: Intubation Performed by: Meaghen Vecchiarelli Pre-anesthesia Checklist: Patient identified, Patient being monitored, Timeout performed, Emergency Drugs available and Suction available Patient Re-evaluated:Patient Re-evaluated prior to inductionOxygen Delivery Method: Circle system utilized Preoxygenation: Pre-oxygenation with 100% oxygen Intubation Type: IV induction Ventilation: Mask ventilation without difficulty Laryngoscope Size: Miller and 2 Grade View: Grade I Tube type: Oral Tube size: 7.0 mm Number of attempts: 1 Airway Equipment and Method: Stylet Placement Confirmation: ETT inserted through vocal cords under direct vision,  positive ETCO2 and breath sounds checked- equal and bilateral Secured at: 21 cm Tube secured with: Tape Dental Injury: Teeth and Oropharynx as per pre-operative assessment        

## 2015-07-31 DIAGNOSIS — N8111 Cystocele, midline: Secondary | ICD-10-CM | POA: Diagnosis not present

## 2015-07-31 LAB — HEMOGLOBIN: HEMOGLOBIN: 11.3 g/dL — AB (ref 12.0–16.0)

## 2015-07-31 MED ORDER — OXYCODONE-ACETAMINOPHEN 5-325 MG PO TABS: 1.0000 | ORAL_TABLET | ORAL | Status: DC | PRN

## 2015-07-31 MED ORDER — LOSARTAN POTASSIUM 50 MG PO TABS
100.0000 mg | ORAL_TABLET | Freq: Every day | ORAL | Status: DC
  Administered 2015-07-31: 100 mg via ORAL
  Filled 2015-07-31: qty 2

## 2015-07-31 MED ORDER — LEVOTHYROXINE SODIUM 100 MCG PO TABS
100.0000 ug | ORAL_TABLET | Freq: Once | ORAL | Status: AC
  Administered 2015-07-31: 100 ug via ORAL
  Filled 2015-07-31 (×2): qty 1

## 2015-07-31 MED ORDER — DOCUSATE SODIUM 100 MG PO CAPS: 100.0000 mg | ORAL_CAPSULE | Freq: Two times a day (BID) | ORAL | Status: DC

## 2015-07-31 MED ORDER — IBUPROFEN 800 MG PO TABS: 800.0000 mg | ORAL_TABLET | Freq: Three times a day (TID) | ORAL | Status: DC

## 2015-07-31 NOTE — Progress Notes (Signed)
Discharge instructions provided.  Pt and sig other verbalize understanding of all instructions and follow-up care.  Prescriptions given.  Pt discharged to home at 1515 on 07/31/15 via wheelchair by volunteer. Reed Breech, RN 07/31/2015 3:54 PM

## 2015-07-31 NOTE — Discharge Summary (Signed)
Physician Discharge Summary  Patient ID: Meagan Cain MRN: [id number] DOB/AGE: 10-17-1945 69 y.o.  Admit date: 07/30/2015 Discharge date: 10/25/2016id you examine  Admission Diagnoses: 1. Second to third-degree cystocele, symptomatic 2. Rectocele, mild 3. Possible enterocele  Discharge Diagnoses:  1. Second third degree cystocele, symptomatic 2. Status post anterior colporrhaphy  Discharged Condition: good  Hospital Course: Uncomplicated  Consults: None  Significant Diagnostic Studies: none  Treatments: surgery: Anterior colporrhaphy  Discharge Exam: Blood pressure 125/60, pulse 65, temperature 98 F (36.7 C), temperature source Oral, resp. rate 18, height 5\' 1"  (1.549 m), weight 143 lb (64.864 kg), SpO2 99 %. General appearance: alert and cooperative Resp: clear to auscultation bilaterally Cardio: regular rate and rhythm, S1, S2 normal, no murmur, click, rub or gallop GI: soft, non-tender; bowel sounds normal; no masses,  no organomegaly Extremities: extremities normal, atraumatic, no cyanosis or edema Skin: Skin color, texture, turgor normal. No rashes or lesions or .  Disposition:   1. Home 2. No heavy lifting 6 weeks; no vaginal intercourse 6 weeks 3. Return in 1 week as scheduled 4. Return as needed for fever, nausea vomiting, vaginal bleeding.     Medication List    ASK your doctor about these medications        aspirin EC 81 MG tablet  Take 81 mg by mouth daily.     conjugated estrogens vaginal cream  Commonly known as:  PREMARIN  Place 1 Applicatorful vaginally 2 (two) times a week. 1 gram two times a week     levothyroxine 100 MCG tablet  Commonly known as:  SYNTHROID, LEVOTHROID  Take 100 mcg by mouth daily before breakfast.     losartan 100 MG tablet  Commonly known as:  COZAAR  Take 100 mg by mouth daily.     nystatin ointment  Commonly known as:  MYCOSTATIN  Apply 1 application topically 2 (two) times daily.     potassium chloride 10  MEQ tablet  Commonly known as:  K-DUR  Take 10 mEq by mouth daily.     pravastatin 20 MG tablet  Commonly known as:  PRAVACHOL  Take by mouth.     RA VITAMIN B-12 TR 1000 MCG Tbcr  Generic drug:  Cyanocobalamin  Take 1,000 mcg by mouth daily.     SM CALCIUM 500/VITAMIN D3 500-400 MG-UNIT tablet  Generic drug:  calcium-vitamin D  Take 1 tablet by mouth daily.     Vitamin D3 2000 UNITS capsule  Take 1,000 Units by mouth daily.           Follow-up Information    Follow up with Brayton Mars, MD. Go in 1 week.   Specialties:  Obstetrics and Gynecology, Radiology   Why:  Post Op Check   Contact information:   [address] [place] [id number] [phone]       Signed: Alanda Slim Tyshia Fenter 07/31/2015, 1:25 PM

## 2015-07-31 NOTE — Discharge Instructions (Signed)
Call your doctor for increased pain or vaginal bleeding, temperature above 100.4, depression, or concerns.  No strenuous activity or heavy lifting for 6 weeks. No intercourse, tampons, douching, or enemas for 6 weeks.  No tub baths- showers only.  No driving for 2 weeks or while taking pain medications. °

## 2015-08-07 ENCOUNTER — Ambulatory Visit (INDEPENDENT_AMBULATORY_CARE_PROVIDER_SITE_OTHER): Payer: Medicare Other | Admitting: Obstetrics and Gynecology

## 2015-08-07 ENCOUNTER — Encounter: Payer: Self-pay | Admitting: Obstetrics and Gynecology

## 2015-08-07 VITALS — BP 113/77 | HR 80 | Ht 61.0 in | Wt 140.5 lb

## 2015-08-07 DIAGNOSIS — Z09 Encounter for follow-up examination after completed treatment for conditions other than malignant neoplasm: Secondary | ICD-10-CM

## 2015-08-07 DIAGNOSIS — N8110 Cystocele, unspecified: Secondary | ICD-10-CM

## 2015-08-07 DIAGNOSIS — IMO0002 Reserved for concepts with insufficient information to code with codable children: Secondary | ICD-10-CM

## 2015-08-07 NOTE — Patient Instructions (Signed)
1.  Resume activities as tolerated with the exception of intercourse. 2.  Continue to avoid heavy lifting for 5 more weeks. 3.  Return in 5 weeks for final postop check.

## 2015-08-07 NOTE — Progress Notes (Signed)
Patient ID: Meagan Cain, female   DOB: 31-Mar-1946, 69 y.o.   MRN: [id number] 8 day post op ant repair 10/24  ibup x 2days post op only No fevers Bladder/bowels- good Vaginal d/c- brown yellow -slight  Chief complaint: 1.  One week postop check. 2.  Status post anterior colporrhaphy.  Patient presents for her 1 week postop visit.  She is doing well with normal bowel and bladder function.  She is not experiencing any pelvic discomfort that would require analgesics.No significant vaginal bleeding; minimal discharge.   IMPRESSION: 1.  Status post anterior colporrhaphy. 2.  Normal postop visit.  PLAN: 1.  Continue with limited lifting. 2.  Return in 5 weeks for final postop check.  Brayton Mars, MD

## 2015-08-08 ENCOUNTER — Ambulatory Visit: Payer: No Typology Code available for payment source | Admitting: Obstetrics and Gynecology

## 2015-09-12 ENCOUNTER — Encounter: Payer: Self-pay | Admitting: Obstetrics and Gynecology

## 2015-09-12 ENCOUNTER — Ambulatory Visit (INDEPENDENT_AMBULATORY_CARE_PROVIDER_SITE_OTHER): Payer: Medicare Other | Admitting: Obstetrics and Gynecology

## 2015-09-12 VITALS — BP 123/80 | HR 75 | Ht 61.0 in | Wt 138.3 lb

## 2015-09-12 DIAGNOSIS — N8110 Cystocele, unspecified: Secondary | ICD-10-CM

## 2015-09-12 DIAGNOSIS — N952 Postmenopausal atrophic vaginitis: Secondary | ICD-10-CM

## 2015-09-12 DIAGNOSIS — Z09 Encounter for follow-up examination after completed treatment for conditions other than malignant neoplasm: Secondary | ICD-10-CM

## 2015-09-12 DIAGNOSIS — IMO0002 Reserved for concepts with insufficient information to code with codable children: Secondary | ICD-10-CM

## 2015-09-12 NOTE — Patient Instructions (Signed)
1.  Resume all activities without restriction. 2.  Begin estrogen cream intravaginally twice a week. 3.  Return in 6 months for follow-up.

## 2015-09-12 NOTE — Progress Notes (Signed)
Chief complaint: 1.  Pelvic organ prolapse. 2.  5 weeks status post anterior colporrhaphy.  Patient is now 5 weeks status post repair of third-degree cystocele.  Her postoperative recovery has been outstanding.  Minimal pain.  Bowel and bladder function are normal.  She is voiding completely without Leaking.  No vaginal discharge or bleeding.  Past mental history, past surgical history, problem list, medications, and allergies all reviewed  Review of systems: Per HPI.  OBJECTIVE: BP 123/80 mmHg  Pulse 75  Ht 5\' 1"  (1.549 m)  Wt 138 lb 5 oz (62.738 kg)  BMI 26.15 kg/m2 Pleasant, well-appearing white female in no acute distress. Abdomen: Soft, nontender. Pelvic exam: External genitalia-normal BUS-small urethral caruncle. Vagina-well supported; anterior vaginal wall.  Suture line is healed. Mild atrophy present. Cervix-surgically absent Uterus-surgically absent. Bimanual-vaginal cuff is intact, nontender, non-scarred  ASSESSMENT: 1.  5 weeks status post anterior colporrhaphy for symptomatic third-degree cystocele. 2.  Vaginal atrophy.  PLAN: 1.  Resume Premarin cream intravaginal biweekly. 2.  Resume activities as tolerated including intercourse. 3.  Return in 6 months for follow-up  Brayton Mars, MD

## 2016-02-18 ENCOUNTER — Other Ambulatory Visit: Payer: Self-pay

## 2016-02-18 MED ORDER — ESTROGENS, CONJUGATED 0.625 MG/GM VA CREA: 1.0000 | TOPICAL_CREAM | VAGINAL | Status: DC

## 2016-03-12 ENCOUNTER — Ambulatory Visit (INDEPENDENT_AMBULATORY_CARE_PROVIDER_SITE_OTHER): Payer: Medicare Other | Admitting: Obstetrics and Gynecology

## 2016-03-12 ENCOUNTER — Encounter: Payer: Self-pay | Admitting: Obstetrics and Gynecology

## 2016-03-12 VITALS — BP 129/78 | HR 73 | Wt 141.1 lb

## 2016-03-12 DIAGNOSIS — N8110 Cystocele, unspecified: Secondary | ICD-10-CM | POA: Diagnosis not present

## 2016-03-12 DIAGNOSIS — N952 Postmenopausal atrophic vaginitis: Secondary | ICD-10-CM

## 2016-03-12 DIAGNOSIS — IMO0002 Reserved for concepts with insufficient information to code with codable children: Secondary | ICD-10-CM

## 2016-03-12 NOTE — Patient Instructions (Signed)
1. Continue with Premarin cream intravaginal twice a week 2. Return in 1 year for follow-up 3. Should left lower quadrant pain repair, contact us for pelvic ultrasound to assess ovaries 4. Follow-up with Dr. Doy Hutching for routine wellness checks.

## 2016-03-12 NOTE — Progress Notes (Signed)
Chief complaint: 1. Follow-up on cystocele repair  Patient is now 6 months status post anterior colporrhaphy for management of symptomatic third-degree cystocele. She also has vaginal atrophy.  Patient is using Premarin cream twice a week.  Bowel and bladder function are normal. She is not having any significant urinary incontinence. Patient reports some left lower quadrant discomfort last week which is now resolved. She still has her ovaries.  OBJECTIVE: BP 129/78 mmHg  Pulse 73  Wt 141 lb 2 oz (64.014 kg) Pleasant female in no acute distress. Abdomen: Soft, nontender Pelvic exam: External genitalia-normal BUS-small urethral caruncle Vagina-good support with first-degree cystocele noted. Cervix-surgically absent Uterus surgically absent Bimanual exam-no tenderness or masses appreciated  ASSESSMENT: 1. 6 months status post anterior colporrhaphy for third-degree cystocele, doing well  PLAN: 1. Resume Premarin cream intravaginal twice a week 2. Continue with regular activities as tolerated 3. Return in 1 year for follow-up 4. Continue with Dr. Georgie Chard for routine health care 5. If patient develops recurrent left lower quadrant pain, I would recommend ultrasound to assess ovaries.  A total of 15 minutes were spent face-to-face with the patient during this encounter and over half of that time dealt with counseling and coordination of care.  Brayton Mars, MD  Note: This dictation was prepared with Dragon dictation along with smaller phrase technology. Any transcriptional errors that result from this process are unintentional.

## 2016-04-14 ENCOUNTER — Other Ambulatory Visit: Payer: Self-pay | Admitting: Internal Medicine

## 2016-04-14 DIAGNOSIS — R413 Other amnesia: Secondary | ICD-10-CM

## 2016-04-18 ENCOUNTER — Ambulatory Visit
Admission: RE | Admit: 2016-04-18 | Discharge: 2016-04-18 | Disposition: A | Discharge status: Home / Self Care | Payer: Medicare Other | Source: Ambulatory Visit | Attending: Internal Medicine | Admitting: Internal Medicine

## 2016-04-18 DIAGNOSIS — G319 Degenerative disease of nervous system, unspecified: Secondary | ICD-10-CM | POA: Insufficient documentation

## 2016-04-18 DIAGNOSIS — I6782 Cerebral ischemia: Secondary | ICD-10-CM | POA: Diagnosis not present

## 2016-04-18 DIAGNOSIS — R413 Other amnesia: Secondary | ICD-10-CM

## 2016-04-18 LAB — POCT I-STAT CREATININE: CREATININE: 0.6 mg/dL (ref 0.44–1.00)

## 2016-04-18 MED ORDER — GADOBENATE DIMEGLUMINE 529 MG/ML IV SOLN
15.0000 mL | Freq: Once | INTRAVENOUS | Status: AC | PRN
  Administered 2016-04-18: 13 mL via INTRAVENOUS

## 2016-05-22 ENCOUNTER — Ambulatory Visit: Payer: Medicare Other | Attending: Specialist

## 2016-05-22 DIAGNOSIS — G479 Sleep disorder, unspecified: Secondary | ICD-10-CM | POA: Diagnosis present

## 2016-05-22 DIAGNOSIS — G4733 Obstructive sleep apnea (adult) (pediatric): Secondary | ICD-10-CM | POA: Diagnosis not present

## 2016-11-17 ENCOUNTER — Other Ambulatory Visit: Payer: Self-pay | Admitting: Internal Medicine

## 2016-11-17 DIAGNOSIS — Z1231 Encounter for screening mammogram for malignant neoplasm of breast: Secondary | ICD-10-CM

## 2016-11-24 ENCOUNTER — Encounter (INDEPENDENT_AMBULATORY_CARE_PROVIDER_SITE_OTHER): Payer: Self-pay

## 2016-11-24 ENCOUNTER — Ambulatory Visit
Admission: RE | Admit: 2016-11-24 | Discharge: 2016-11-24 | Disposition: A | Discharge status: Home / Self Care | Payer: PPO | Source: Ambulatory Visit | Attending: Internal Medicine | Admitting: Internal Medicine

## 2016-11-24 DIAGNOSIS — Z1231 Encounter for screening mammogram for malignant neoplasm of breast: Secondary | ICD-10-CM | POA: Insufficient documentation

## 2016-11-28 DIAGNOSIS — Z79899 Other long term (current) drug therapy: Secondary | ICD-10-CM | POA: Diagnosis not present

## 2016-11-28 DIAGNOSIS — I10 Essential (primary) hypertension: Secondary | ICD-10-CM | POA: Diagnosis not present

## 2016-11-28 DIAGNOSIS — E079 Disorder of thyroid, unspecified: Secondary | ICD-10-CM | POA: Diagnosis not present

## 2016-11-28 DIAGNOSIS — E7800 Pure hypercholesterolemia, unspecified: Secondary | ICD-10-CM | POA: Diagnosis not present

## 2016-12-05 DIAGNOSIS — E7800 Pure hypercholesterolemia, unspecified: Secondary | ICD-10-CM | POA: Diagnosis not present

## 2016-12-05 DIAGNOSIS — Z1211 Encounter for screening for malignant neoplasm of colon: Secondary | ICD-10-CM | POA: Diagnosis not present

## 2016-12-05 DIAGNOSIS — M791 Myalgia: Secondary | ICD-10-CM | POA: Diagnosis not present

## 2016-12-05 DIAGNOSIS — I10 Essential (primary) hypertension: Secondary | ICD-10-CM | POA: Diagnosis not present

## 2016-12-05 DIAGNOSIS — E079 Disorder of thyroid, unspecified: Secondary | ICD-10-CM | POA: Diagnosis not present

## 2016-12-29 DIAGNOSIS — Z85828 Personal history of other malignant neoplasm of skin: Secondary | ICD-10-CM | POA: Diagnosis not present

## 2016-12-29 DIAGNOSIS — L821 Other seborrheic keratosis: Secondary | ICD-10-CM | POA: Diagnosis not present

## 2016-12-29 DIAGNOSIS — Z08 Encounter for follow-up examination after completed treatment for malignant neoplasm: Secondary | ICD-10-CM | POA: Diagnosis not present

## 2016-12-29 DIAGNOSIS — L570 Actinic keratosis: Secondary | ICD-10-CM | POA: Diagnosis not present

## 2016-12-29 DIAGNOSIS — X32XXXA Exposure to sunlight, initial encounter: Secondary | ICD-10-CM | POA: Diagnosis not present

## 2017-01-12 DIAGNOSIS — Z1211 Encounter for screening for malignant neoplasm of colon: Secondary | ICD-10-CM | POA: Diagnosis not present

## 2017-01-16 DIAGNOSIS — R5381 Other malaise: Secondary | ICD-10-CM | POA: Diagnosis not present

## 2017-01-16 DIAGNOSIS — R5382 Chronic fatigue, unspecified: Secondary | ICD-10-CM | POA: Diagnosis not present

## 2017-01-16 DIAGNOSIS — E7800 Pure hypercholesterolemia, unspecified: Secondary | ICD-10-CM | POA: Diagnosis not present

## 2017-01-16 DIAGNOSIS — I10 Essential (primary) hypertension: Secondary | ICD-10-CM | POA: Diagnosis not present

## 2017-01-16 DIAGNOSIS — E079 Disorder of thyroid, unspecified: Secondary | ICD-10-CM | POA: Diagnosis not present

## 2017-02-27 DIAGNOSIS — E039 Hypothyroidism, unspecified: Secondary | ICD-10-CM | POA: Diagnosis not present

## 2017-02-27 DIAGNOSIS — F329 Major depressive disorder, single episode, unspecified: Secondary | ICD-10-CM | POA: Diagnosis not present

## 2017-02-27 DIAGNOSIS — Z79899 Other long term (current) drug therapy: Secondary | ICD-10-CM | POA: Diagnosis not present

## 2017-02-27 DIAGNOSIS — I10 Essential (primary) hypertension: Secondary | ICD-10-CM | POA: Diagnosis not present

## 2017-02-27 DIAGNOSIS — E7800 Pure hypercholesterolemia, unspecified: Secondary | ICD-10-CM | POA: Diagnosis not present

## 2017-03-17 ENCOUNTER — Encounter: Payer: Medicare Other | Admitting: Obstetrics and Gynecology

## 2017-03-18 ENCOUNTER — Encounter: Payer: Medicare Other | Admitting: Obstetrics and Gynecology

## 2017-05-29 DIAGNOSIS — Z79899 Other long term (current) drug therapy: Secondary | ICD-10-CM | POA: Diagnosis not present

## 2017-05-29 DIAGNOSIS — E7800 Pure hypercholesterolemia, unspecified: Secondary | ICD-10-CM | POA: Diagnosis not present

## 2017-05-29 DIAGNOSIS — I10 Essential (primary) hypertension: Secondary | ICD-10-CM | POA: Diagnosis not present

## 2017-05-29 DIAGNOSIS — E039 Hypothyroidism, unspecified: Secondary | ICD-10-CM | POA: Diagnosis not present

## 2017-06-05 DIAGNOSIS — E079 Disorder of thyroid, unspecified: Secondary | ICD-10-CM | POA: Diagnosis not present

## 2017-06-05 DIAGNOSIS — I10 Essential (primary) hypertension: Secondary | ICD-10-CM | POA: Diagnosis not present

## 2017-06-05 DIAGNOSIS — E876 Hypokalemia: Secondary | ICD-10-CM | POA: Diagnosis not present

## 2017-06-05 DIAGNOSIS — F329 Major depressive disorder, single episode, unspecified: Secondary | ICD-10-CM | POA: Diagnosis not present

## 2017-08-21 DIAGNOSIS — E079 Disorder of thyroid, unspecified: Secondary | ICD-10-CM | POA: Diagnosis not present

## 2017-09-04 DIAGNOSIS — E7800 Pure hypercholesterolemia, unspecified: Secondary | ICD-10-CM | POA: Diagnosis not present

## 2017-09-04 DIAGNOSIS — E079 Disorder of thyroid, unspecified: Secondary | ICD-10-CM | POA: Diagnosis not present

## 2017-09-04 DIAGNOSIS — Z79899 Other long term (current) drug therapy: Secondary | ICD-10-CM | POA: Diagnosis not present

## 2017-11-24 DIAGNOSIS — E079 Disorder of thyroid, unspecified: Secondary | ICD-10-CM | POA: Diagnosis not present

## 2017-11-24 DIAGNOSIS — Z79899 Other long term (current) drug therapy: Secondary | ICD-10-CM | POA: Diagnosis not present

## 2017-11-24 DIAGNOSIS — E7800 Pure hypercholesterolemia, unspecified: Secondary | ICD-10-CM | POA: Diagnosis not present

## 2017-12-01 DIAGNOSIS — Z1231 Encounter for screening mammogram for malignant neoplasm of breast: Secondary | ICD-10-CM | POA: Diagnosis not present

## 2017-12-01 DIAGNOSIS — E871 Hypo-osmolality and hyponatremia: Secondary | ICD-10-CM | POA: Diagnosis not present

## 2017-12-01 DIAGNOSIS — Z1211 Encounter for screening for malignant neoplasm of colon: Secondary | ICD-10-CM | POA: Diagnosis not present

## 2017-12-01 DIAGNOSIS — F329 Major depressive disorder, single episode, unspecified: Secondary | ICD-10-CM | POA: Diagnosis not present

## 2017-12-01 DIAGNOSIS — Z79899 Other long term (current) drug therapy: Secondary | ICD-10-CM | POA: Diagnosis not present

## 2017-12-01 DIAGNOSIS — E079 Disorder of thyroid, unspecified: Secondary | ICD-10-CM | POA: Diagnosis not present

## 2017-12-01 DIAGNOSIS — K219 Gastro-esophageal reflux disease without esophagitis: Secondary | ICD-10-CM | POA: Diagnosis not present

## 2017-12-01 DIAGNOSIS — Z0000 Encounter for general adult medical examination without abnormal findings: Secondary | ICD-10-CM | POA: Diagnosis not present

## 2017-12-01 DIAGNOSIS — N952 Postmenopausal atrophic vaginitis: Secondary | ICD-10-CM | POA: Diagnosis not present

## 2017-12-01 DIAGNOSIS — I10 Essential (primary) hypertension: Secondary | ICD-10-CM | POA: Diagnosis not present

## 2017-12-01 DIAGNOSIS — E7800 Pure hypercholesterolemia, unspecified: Secondary | ICD-10-CM | POA: Diagnosis not present

## 2017-12-22 DIAGNOSIS — L290 Pruritus ani: Secondary | ICD-10-CM | POA: Diagnosis not present

## 2017-12-22 DIAGNOSIS — Z1211 Encounter for screening for malignant neoplasm of colon: Secondary | ICD-10-CM | POA: Diagnosis not present

## 2017-12-30 ENCOUNTER — Other Ambulatory Visit: Payer: Self-pay | Admitting: Internal Medicine

## 2017-12-30 DIAGNOSIS — Z1231 Encounter for screening mammogram for malignant neoplasm of breast: Secondary | ICD-10-CM

## 2018-01-08 DIAGNOSIS — D485 Neoplasm of uncertain behavior of skin: Secondary | ICD-10-CM | POA: Diagnosis not present

## 2018-01-08 DIAGNOSIS — D2261 Melanocytic nevi of right upper limb, including shoulder: Secondary | ICD-10-CM | POA: Diagnosis not present

## 2018-01-08 DIAGNOSIS — D2271 Melanocytic nevi of right lower limb, including hip: Secondary | ICD-10-CM | POA: Diagnosis not present

## 2018-01-08 DIAGNOSIS — L821 Other seborrheic keratosis: Secondary | ICD-10-CM | POA: Diagnosis not present

## 2018-01-08 DIAGNOSIS — D2272 Melanocytic nevi of left lower limb, including hip: Secondary | ICD-10-CM | POA: Diagnosis not present

## 2018-01-08 DIAGNOSIS — L820 Inflamed seborrheic keratosis: Secondary | ICD-10-CM | POA: Diagnosis not present

## 2018-01-08 DIAGNOSIS — Z08 Encounter for follow-up examination after completed treatment for malignant neoplasm: Secondary | ICD-10-CM | POA: Diagnosis not present

## 2018-01-08 DIAGNOSIS — Z85828 Personal history of other malignant neoplasm of skin: Secondary | ICD-10-CM | POA: Diagnosis not present

## 2018-01-08 DIAGNOSIS — D2262 Melanocytic nevi of left upper limb, including shoulder: Secondary | ICD-10-CM | POA: Diagnosis not present

## 2018-01-13 ENCOUNTER — Encounter: Payer: Self-pay | Admitting: Obstetrics and Gynecology

## 2018-01-13 ENCOUNTER — Ambulatory Visit (INDEPENDENT_AMBULATORY_CARE_PROVIDER_SITE_OTHER): Payer: PPO | Admitting: Obstetrics and Gynecology

## 2018-01-13 VITALS — BP 138/77 | HR 73 | Ht 61.0 in | Wt 143.0 lb

## 2018-01-13 DIAGNOSIS — Z90710 Acquired absence of both cervix and uterus: Secondary | ICD-10-CM | POA: Diagnosis not present

## 2018-01-13 DIAGNOSIS — N952 Postmenopausal atrophic vaginitis: Secondary | ICD-10-CM | POA: Diagnosis not present

## 2018-01-13 DIAGNOSIS — N8111 Cystocele, midline: Secondary | ICD-10-CM

## 2018-01-13 NOTE — Patient Instructions (Signed)
1.  Continue with Premarin cream intravaginal 1/2 g twice a week 2.  Return in 1 year for follow-up

## 2018-01-13 NOTE — Progress Notes (Signed)
Chief complaint: 1.  Midline cystocele 2.  Status post vaginal hysterectomy 3.  Vaginal atrophy 4.  Status post anterior colporrhaphy  Patient presents for yearly follow-up.  She has had pelvic organ prolapse surgery in the past.  Previous exam was notable for a first-degree cystocele.  She is using Premarin cream intravaginal 1 g weekly.  She is having normal bladder function without stress incontinence/leakage.  She does not have unstable bladder symptoms.  She has nocturia x2. Bowel function is approximately every other day with mild constipation.  No significant splinting is needed for bowel movements.  Past medical history, past surgical history, problem list, medications, and allergies are reviewed  OBJECTIVE: BP 138/77   Pulse 73   Ht 5\' 1"  (1.549 m)   Wt 143 lb (64.9 kg)   BMI 27.02 kg/m  Pleasant well-appearing female in no acute distress.  Alert and oriented. Abdomen: Soft, nontender without organomegaly; no pelvic masses Pelvic exam: External genitalia-normal BUS-small mild urethral caruncle Vagina-first-degree cystocele; mild rectocele; no significant enterocele; fair estrogen effect Cervix-surgically absent Uterus-surgically absent Bladder-nontender Rectovaginal-normal external exam Bimanual-no palpable abnormalities; no adnexal masses  ASSESSMENT: 1.  Midline cystocele, first-degree, asymptomatic 2.  Vaginal atrophy, stable, with Premarin cream use 3.  Status post vaginal hysterectomy 4.  Status post anterior colporrhaphy  PLAN: 1.  Continue Premarin cream; recommend 1/2 g twice weekly rather than 1 g weekly in order to minimize loss of medication through leakage during the application 2.  Return in 1 year for follow-up  A total of 15 minutes were spent face-to-face with the patient during this encounter and over half of that time dealt with counseling and coordination of care.  Brayton Mars, MD  Note: This dictation was prepared with Dragon dictation  along with smaller phrase technology. Any transcriptional errors that result from this process are unintentional.

## 2018-01-19 ENCOUNTER — Ambulatory Visit
Admission: RE | Admit: 2018-01-19 | Discharge: 2018-01-19 | Disposition: A | Discharge status: Home / Self Care | Payer: PPO | Source: Ambulatory Visit | Attending: Internal Medicine | Admitting: Internal Medicine

## 2018-01-19 DIAGNOSIS — Z1231 Encounter for screening mammogram for malignant neoplasm of breast: Secondary | ICD-10-CM | POA: Insufficient documentation

## 2018-03-03 ENCOUNTER — Ambulatory Visit
Admission: RE | Admit: 2018-03-03 | Discharge: 2018-03-03 | Disposition: A | Discharge status: Home / Self Care | Payer: PPO | Source: Ambulatory Visit | Attending: Unknown Physician Specialty | Admitting: Unknown Physician Specialty

## 2018-03-03 ENCOUNTER — Ambulatory Visit: Payer: PPO | Admitting: Registered Nurse

## 2018-03-03 ENCOUNTER — Encounter: Payer: Self-pay | Admitting: *Deleted

## 2018-03-03 ENCOUNTER — Encounter
Admission: RE | Disposition: A | Discharge status: Home / Self Care | Payer: Self-pay | Source: Ambulatory Visit | Attending: Unknown Physician Specialty

## 2018-03-03 DIAGNOSIS — K219 Gastro-esophageal reflux disease without esophagitis: Secondary | ICD-10-CM | POA: Diagnosis not present

## 2018-03-03 DIAGNOSIS — Z85828 Personal history of other malignant neoplasm of skin: Secondary | ICD-10-CM | POA: Insufficient documentation

## 2018-03-03 DIAGNOSIS — E785 Hyperlipidemia, unspecified: Secondary | ICD-10-CM | POA: Insufficient documentation

## 2018-03-03 DIAGNOSIS — K640 First degree hemorrhoids: Secondary | ICD-10-CM | POA: Insufficient documentation

## 2018-03-03 DIAGNOSIS — E559 Vitamin D deficiency, unspecified: Secondary | ICD-10-CM | POA: Insufficient documentation

## 2018-03-03 DIAGNOSIS — Z888 Allergy status to other drugs, medicaments and biological substances status: Secondary | ICD-10-CM | POA: Insufficient documentation

## 2018-03-03 DIAGNOSIS — K635 Polyp of colon: Secondary | ICD-10-CM | POA: Diagnosis not present

## 2018-03-03 DIAGNOSIS — Z79899 Other long term (current) drug therapy: Secondary | ICD-10-CM | POA: Diagnosis not present

## 2018-03-03 DIAGNOSIS — Z8619 Personal history of other infectious and parasitic diseases: Secondary | ICD-10-CM | POA: Insufficient documentation

## 2018-03-03 DIAGNOSIS — Z882 Allergy status to sulfonamides status: Secondary | ICD-10-CM | POA: Insufficient documentation

## 2018-03-03 DIAGNOSIS — K5790 Diverticulosis of intestine, part unspecified, without perforation or abscess without bleeding: Secondary | ICD-10-CM | POA: Diagnosis not present

## 2018-03-03 DIAGNOSIS — Z808 Family history of malignant neoplasm of other organs or systems: Secondary | ICD-10-CM | POA: Diagnosis not present

## 2018-03-03 DIAGNOSIS — I10 Essential (primary) hypertension: Secondary | ICD-10-CM | POA: Insufficient documentation

## 2018-03-03 DIAGNOSIS — Z1211 Encounter for screening for malignant neoplasm of colon: Secondary | ICD-10-CM | POA: Insufficient documentation

## 2018-03-03 DIAGNOSIS — E039 Hypothyroidism, unspecified: Secondary | ICD-10-CM | POA: Diagnosis not present

## 2018-03-03 DIAGNOSIS — Z881 Allergy status to other antibiotic agents status: Secondary | ICD-10-CM | POA: Insufficient documentation

## 2018-03-03 DIAGNOSIS — D125 Benign neoplasm of sigmoid colon: Secondary | ICD-10-CM | POA: Diagnosis not present

## 2018-03-03 DIAGNOSIS — Z7982 Long term (current) use of aspirin: Secondary | ICD-10-CM | POA: Diagnosis not present

## 2018-03-03 DIAGNOSIS — K648 Other hemorrhoids: Secondary | ICD-10-CM | POA: Diagnosis not present

## 2018-03-03 DIAGNOSIS — K5730 Diverticulosis of large intestine without perforation or abscess without bleeding: Secondary | ICD-10-CM | POA: Diagnosis not present

## 2018-03-03 DIAGNOSIS — D126 Benign neoplasm of colon, unspecified: Secondary | ICD-10-CM | POA: Diagnosis not present

## 2018-03-03 DIAGNOSIS — Z79890 Hormone replacement therapy (postmenopausal): Secondary | ICD-10-CM | POA: Diagnosis not present

## 2018-03-03 DIAGNOSIS — F329 Major depressive disorder, single episode, unspecified: Secondary | ICD-10-CM | POA: Insufficient documentation

## 2018-03-03 HISTORY — PX: COLONOSCOPY: SHX5424

## 2018-03-03 SURGERY — COLONOSCOPY
Anesthesia: General

## 2018-03-03 MED ORDER — SODIUM CHLORIDE 0.9 % IV SOLN: INTRAVENOUS | Status: DC

## 2018-03-03 MED ORDER — PROPOFOL 10 MG/ML IV BOLUS
INTRAVENOUS | Status: DC | PRN
  Administered 2018-03-03: 50 mg via INTRAVENOUS

## 2018-03-03 MED ORDER — SODIUM CHLORIDE 0.9 % IV SOLN
INTRAVENOUS | Status: DC
  Administered 2018-03-03 (×2): via INTRAVENOUS

## 2018-03-03 MED ORDER — PROPOFOL 500 MG/50ML IV EMUL
INTRAVENOUS | Status: DC | PRN
  Administered 2018-03-03: 75 ug/kg/min via INTRAVENOUS

## 2018-03-03 NOTE — Transfer of Care (Signed)
Immediate Anesthesia Transfer of Care Note  Patient: Meagan Cain  Procedure(s) Performed: COLONOSCOPY (N/A )  Patient Location: PACU  Anesthesia Type:General  Level of Consciousness: awake and alert   Airway & Oxygen Therapy: Patient Spontanous Breathing  Post-op Assessment: Report given to RN  Post vital signs: Reviewed and stable  Last Vitals:  Vitals Value Taken Time  BP    Temp    Pulse    Resp    SpO2      Last Pain:  Vitals:   03/03/18 1038  TempSrc: Tympanic         Complications: No apparent anesthesia complications

## 2018-03-03 NOTE — Anesthesia Post-op Follow-up Note (Signed)
Anesthesia QCDR form completed.        

## 2018-03-03 NOTE — Op Note (Signed)
[facility] Gastroenterology Patient Name: Meagan Cain Procedure Date: 03/03/2018 11:45 AM MRN: [id number] Account #: [id number] Date of Birth: 01-25-1946 Admit Type: Outpatient Age: 72 Room: [facility] ENDO ROOM 3 Gender: Female Note Status: Finalized Procedure:            Colonoscopy Indications:          Screening for colorectal malignant neoplasm Providers:            Manya Silvas, MD Referring MD:         Leonie Douglas. Doy Hutching, MD (Referring MD) Medicines:            Propofol per Anesthesia Complications:        No immediate complications. Procedure:            Pre-Anesthesia Assessment:                       - After reviewing the risks and benefits, the patient                        was deemed in satisfactory condition to undergo the                        procedure.                       After obtaining informed consent, the colonoscope was                        passed under direct vision. Throughout the procedure,                        the patient's blood pressure, pulse, and oxygen                        saturations were monitored continuously. The                        Colonoscope was introduced through the anus and                        advanced to the the cecum, identified by appendiceal                        orifice and ileocecal valve. The colonoscopy was                        performed without difficulty. The patient tolerated the                        procedure well. The quality of the bowel preparation                        was excellent. Findings:      A medium polyp was found in the distal sigmoid colon. The polyp was       sessile. The polyp was removed with a hot snare. Resection and retrieval       were complete. To prevent bleeding after the polypectomy, one hemostatic       clip was successfully placed. There was no bleeding during, or at the       end, of the procedure.  Multiple small-mouthed diverticula were found in the  sigmoid colon,       descending colon and transverse colon.      Internal hemorrhoids were found during endoscopy. The hemorrhoids were       small and Grade I (internal hemorrhoids that do not prolapse). Impression:           - One medium polyp in the distal sigmoid colon, removed                        with a hot snare. Resected and retrieved. Clip was                        placed.                       - Diverticulosis in the sigmoid colon, in the                        descending colon and in the transverse colon.                       - Internal hemorrhoids. Recommendation:       - Await pathology results. Manya Silvas, MD 03/03/2018 12:23:26 PM This report has been signed electronically. Number of Addenda: 0 Note Initiated On: 03/03/2018 11:45 AM Scope Withdrawal Time: 0 hours 13 minutes 53 seconds  Total Procedure Duration: 0 hours 21 minutes 54 seconds       [facility]

## 2018-03-03 NOTE — H&P (Signed)
Primary Care Physician:  Idelle Crouch, MD Primary Gastroenterologist:  Dr. Vira Agar  Pre-Procedure History & Physical: HPI:  Meagan Cain is a 72 y.o. female is here for an colonoscopy.  Done for colon cancer screening.   Past Medical History:  Diagnosis Date  . Arthritis   . Cancer ([place])    skin cancer  . Chronic vaginitis   . Cystocele   . Depression   . Effusion, pericardium   . History of shingles   . History of skin cancer   . Hyperlipemia   . Hypertension   . Hypothyroidism   . Osteoporosis   . Rectocele   . Thoracic compression fracture ([place])   . Urinary incontinence   . Vaginal atrophy   . Vitamin D deficiency     Past Surgical History:  Procedure Laterality Date  . APPENDECTOMY    . CHOLECYSTECTOMY  2003  . CYSTOCELE REPAIR  07/30/2015   Procedure: ANTERIOR REPAIR (CYSTOCELE);  Surgeon: Brayton Mars, MD;  Location: [facility] ORS;  Service: Gynecology;;  . EYE SURGERY Bilateral    Cataract Extraction with IOL implants  . TONSILLECTOMY    . VAGINAL HYSTERECTOMY      Prior to Admission medications   Medication Sig Start Date End Date Taking? Authorizing Provider  aspirin EC 81 MG tablet Take 81 mg by mouth daily.    Yes [provider]  Cholecalciferol (VITAMIN D3) 2000 UNITS capsule Take 1,000 Units by mouth daily.    Yes [provider]  conjugated estrogens (PREMARIN) vaginal cream Place 1 Applicatorful vaginally 2 (two) times a week. 1 gram two times a week 02/18/16  Yes Defrancesco, Alanda Slim, MD  losartan (COZAAR) 100 MG tablet Take 100 mg by mouth daily.   Yes [provider]  potassium chloride (K-DUR) 10 MEQ tablet Take 10 mEq by mouth daily.  02/08/15  Yes [provider]  Cyanocobalamin (RA VITAMIN B-12 TR) 1000 MCG TBCR Take 1,000 mcg by mouth daily.     [provider]  levothyroxine (SYNTHROID, LEVOTHROID) 100 MCG tablet Take 100 mcg by mouth daily before breakfast.  02/08/15 01/13/18  [provider]    Allergies as of 03/02/2018 - Review Complete 03/02/2018  Allergen Reaction Noted  . Ceftin [cefuroxime axetil]  03/02/2018  . Cephalosporins Nausea And Vomiting 05/29/2015  . Ibandronic acid Nausea And Vomiting 05/29/2015  . Sulfa antibiotics Rash 04/30/2015    Family History  Problem Relation Age of Onset  . Heart disease Mother   . Diabetes Mother   . Heart disease Father   . Throat cancer Maternal Grandmother   . Heart disease Brother   . Diabetes Brother   . Cancer Neg Hx   . Breast cancer Neg Hx   . Ovarian cancer Neg Hx   . Colon cancer Neg Hx     Social History   Socioeconomic History  . Marital status: Married    Spouse name: Not on file  . Number of children: Not on file  . Years of education: Not on file  . Highest education level: Not on file  Occupational History  . Not on file  Social Needs  . Financial resource strain: Not on file  . Food insecurity:    Worry: Not on file    Inability: Not on file  . Transportation needs:    Medical: Not on file    Non-medical: Not on file  Tobacco Use  . Smoking status: Never Smoker  .  Smokeless tobacco: Never Used  Substance and Sexual Activity  . Alcohol use: No    Alcohol/week: 0.0 oz  . Drug use: No  . Sexual activity: Yes    Partners: Female    Birth control/protection: Surgical  Lifestyle  . Physical activity:    Days per week: Not on file    Minutes per session: Not on file  . Stress: Not on file  Relationships  . Social connections:    Talks on phone: Not on file    Gets together: Not on file    Attends religious service: Not on file    Active member of club or organization: Not on file    Attends meetings of clubs or organizations: Not on file    Relationship status: Not on file  . Intimate partner violence:    Fear of current or ex partner: Not on file    Emotionally abused: Not on file    Physically abused: Not on file    Forced sexual activity: Not on file  Other  Topics Concern  . Not on file  Social History Narrative  . Not on file    Review of Systems: See HPI, otherwise negative ROS  Physical Exam: BP 124/74   Pulse 67   Temp (!) 97.3 F (36.3 C) (Tympanic)   Resp 18   Ht 5\' 1"  (1.549 m)   Wt 65.3 kg (144 lb)   SpO2 100%   BMI 27.21 kg/m  General:   Alert,  pleasant and cooperative in NAD Head:  Normocephalic and atraumatic. Neck:  Supple; no masses or thyromegaly. Lungs:  Clear throughout to auscultation.    Heart:  Regular rate and rhythm. Abdomen:  Soft, nontender and nondistended. Normal bowel sounds, without guarding, and without rebound.   Neurologic:  Alert and  oriented x4;  grossly normal neurologically.  Impression/Plan: Meagan Cain is here for an colonoscopy to be performed for colon cancer screening  Risks, benefits, limitations, and alternatives regarding  colonoscopy have been reviewed with the patient.  Questions have been answered.  All parties agreeable.   Gaylyn Cheers, MD  03/03/2018, 11:47 AM

## 2018-03-04 ENCOUNTER — Encounter: Payer: Self-pay | Admitting: Unknown Physician Specialty

## 2018-03-05 LAB — SURGICAL PATHOLOGY

## 2018-03-08 NOTE — Anesthesia Postprocedure Evaluation (Signed)
Anesthesia Post Note  Patient: Meagan Cain  Procedure(s) Performed: COLONOSCOPY (N/A )  Patient location during evaluation: PACU Anesthesia Type: General Level of consciousness: awake and alert Pain management: pain level controlled Vital Signs Assessment: post-procedure vital signs reviewed and stable Respiratory status: spontaneous breathing, nonlabored ventilation, respiratory function stable and patient connected to nasal cannula oxygen Cardiovascular status: blood pressure returned to baseline and stable Postop Assessment: no apparent nausea or vomiting Anesthetic complications: no     Last Vitals:  Vitals:   03/03/18 1243 03/03/18 1253  BP: 129/77 130/77  Pulse: (!) 57 (!) 59  Resp: 15 15  Temp:    SpO2: 100% 100%    Last Pain:  Vitals:   03/04/18 0748  TempSrc:   PainSc: 0-No pain                 Molli Barrows

## 2018-03-08 NOTE — Anesthesia Preprocedure Evaluation (Signed)
Anesthesia Evaluation  Patient identified by MRN, date of birth, ID band Patient awake    Reviewed: Allergy & Precautions, H&P , NPO status , Patient's Chart, lab work & pertinent test results, reviewed documented beta blocker date and time   Airway Mallampati: II   Neck ROM: full    Dental  (+) Poor Dentition   Pulmonary neg pulmonary ROS,    Pulmonary exam normal        Cardiovascular hypertension, negative cardio ROS Normal cardiovascular exam Rhythm:regular Rate:Normal     Neuro/Psych negative neurological ROS  negative psych ROS   GI/Hepatic negative GI ROS, Neg liver ROS, GERD  ,  Endo/Other  negative endocrine ROS  Renal/GU negative Renal ROS  negative genitourinary   Musculoskeletal   Abdominal   Peds  Hematology negative hematology ROS (+)   Anesthesia Other Findings Past Medical History: No date: Arthritis No date: Cancer ([place])     Comment:  skin cancer No date: Chronic vaginitis No date: Cystocele No date: Depression No date: Effusion, pericardium No date: History of shingles No date: History of skin cancer No date: Hyperlipemia No date: Hypertension No date: Hypothyroidism No date: Osteoporosis No date: Rectocele No date: Thoracic compression fracture ([facility]) No date: Urinary incontinence No date: Vaginal atrophy No date: Vitamin D deficiency Past Surgical History: No date: APPENDECTOMY 2003: CHOLECYSTECTOMY 03/03/2018: COLONOSCOPY; N/A     Comment:  Procedure: COLONOSCOPY;  Surgeon: Manya Silvas, MD;              Location: [facility] ENDOSCOPY;  Service: Endoscopy;                Laterality: N/A; 07/30/2015: CYSTOCELE REPAIR     Comment:  Procedure: ANTERIOR REPAIR (CYSTOCELE);  Surgeon: Brayton Mars, MD;  Location: [facility] ORS;  Service:               Gynecology;; No date: EYE SURGERY; Bilateral     Comment:  Cataract Extraction with IOL implants No date:  TONSILLECTOMY No date: VAGINAL HYSTERECTOMY BMI    Body Mass Index:  27.21 kg/m     Reproductive/Obstetrics negative OB ROS                             Anesthesia Physical Anesthesia Plan  ASA: III  Anesthesia Plan: General   Post-op Pain Management:    Induction:   PONV Risk Score and Plan:   Airway Management Planned:   Additional Equipment:   Intra-op Plan:   Post-operative Plan:   Informed Consent: I have reviewed the patients History and Physical, chart, labs and discussed the procedure including the risks, benefits and alternatives for the proposed anesthesia with the patient or authorized representative who has indicated his/her understanding and acceptance.   Dental Advisory Given  Plan Discussed with: CRNA  Anesthesia Plan Comments:         Anesthesia Quick Evaluation

## 2018-05-26 DIAGNOSIS — E7800 Pure hypercholesterolemia, unspecified: Secondary | ICD-10-CM | POA: Diagnosis not present

## 2018-05-26 DIAGNOSIS — Z79899 Other long term (current) drug therapy: Secondary | ICD-10-CM | POA: Diagnosis not present

## 2018-05-26 DIAGNOSIS — E079 Disorder of thyroid, unspecified: Secondary | ICD-10-CM | POA: Diagnosis not present

## 2018-06-02 DIAGNOSIS — F329 Major depressive disorder, single episode, unspecified: Secondary | ICD-10-CM | POA: Diagnosis not present

## 2018-06-02 DIAGNOSIS — M791 Myalgia, unspecified site: Secondary | ICD-10-CM | POA: Diagnosis not present

## 2018-06-02 DIAGNOSIS — M25551 Pain in right hip: Secondary | ICD-10-CM | POA: Diagnosis not present

## 2018-06-02 DIAGNOSIS — I10 Essential (primary) hypertension: Secondary | ICD-10-CM | POA: Diagnosis not present

## 2018-06-02 DIAGNOSIS — M25552 Pain in left hip: Secondary | ICD-10-CM | POA: Diagnosis not present

## 2018-06-22 DIAGNOSIS — M25562 Pain in left knee: Secondary | ICD-10-CM | POA: Diagnosis not present

## 2018-06-24 ENCOUNTER — Other Ambulatory Visit: Payer: Self-pay | Admitting: Unknown Physician Specialty

## 2018-06-24 DIAGNOSIS — M25562 Pain in left knee: Secondary | ICD-10-CM

## 2018-07-13 ENCOUNTER — Ambulatory Visit
Admission: RE | Admit: 2018-07-13 | Discharge: 2018-07-13 | Disposition: A | Discharge status: Home / Self Care | Payer: PPO | Source: Ambulatory Visit | Attending: Unknown Physician Specialty | Admitting: Unknown Physician Specialty

## 2018-07-13 DIAGNOSIS — M25562 Pain in left knee: Secondary | ICD-10-CM

## 2018-08-13 DIAGNOSIS — J069 Acute upper respiratory infection, unspecified: Secondary | ICD-10-CM | POA: Diagnosis not present

## 2018-09-09 DIAGNOSIS — E079 Disorder of thyroid, unspecified: Secondary | ICD-10-CM | POA: Diagnosis not present

## 2018-09-09 DIAGNOSIS — E7800 Pure hypercholesterolemia, unspecified: Secondary | ICD-10-CM | POA: Diagnosis not present

## 2018-09-09 DIAGNOSIS — M25562 Pain in left knee: Secondary | ICD-10-CM | POA: Diagnosis not present

## 2018-09-09 DIAGNOSIS — M810 Age-related osteoporosis without current pathological fracture: Secondary | ICD-10-CM | POA: Diagnosis not present

## 2018-09-09 DIAGNOSIS — E871 Hypo-osmolality and hyponatremia: Secondary | ICD-10-CM | POA: Diagnosis not present

## 2018-09-09 DIAGNOSIS — K219 Gastro-esophageal reflux disease without esophagitis: Secondary | ICD-10-CM | POA: Diagnosis not present

## 2018-09-09 DIAGNOSIS — I10 Essential (primary) hypertension: Secondary | ICD-10-CM | POA: Diagnosis not present

## 2018-11-11 DIAGNOSIS — Z961 Presence of intraocular lens: Secondary | ICD-10-CM | POA: Diagnosis not present

## 2018-12-08 DIAGNOSIS — K219 Gastro-esophageal reflux disease without esophagitis: Secondary | ICD-10-CM | POA: Diagnosis not present

## 2018-12-08 DIAGNOSIS — E079 Disorder of thyroid, unspecified: Secondary | ICD-10-CM | POA: Diagnosis not present

## 2018-12-08 DIAGNOSIS — I10 Essential (primary) hypertension: Secondary | ICD-10-CM | POA: Diagnosis not present

## 2018-12-08 DIAGNOSIS — Z0000 Encounter for general adult medical examination without abnormal findings: Secondary | ICD-10-CM | POA: Diagnosis not present

## 2018-12-08 DIAGNOSIS — G720 Drug-induced myopathy: Secondary | ICD-10-CM | POA: Diagnosis not present

## 2018-12-08 DIAGNOSIS — T466X5A Adverse effect of antihyperlipidemic and antiarteriosclerotic drugs, initial encounter: Secondary | ICD-10-CM | POA: Diagnosis not present

## 2018-12-08 DIAGNOSIS — Z79899 Other long term (current) drug therapy: Secondary | ICD-10-CM | POA: Diagnosis not present

## 2018-12-08 DIAGNOSIS — E7800 Pure hypercholesterolemia, unspecified: Secondary | ICD-10-CM | POA: Diagnosis not present

## 2018-12-08 DIAGNOSIS — M810 Age-related osteoporosis without current pathological fracture: Secondary | ICD-10-CM | POA: Diagnosis not present

## 2018-12-08 DIAGNOSIS — Z1239 Encounter for other screening for malignant neoplasm of breast: Secondary | ICD-10-CM | POA: Diagnosis not present

## 2018-12-17 DIAGNOSIS — M810 Age-related osteoporosis without current pathological fracture: Secondary | ICD-10-CM | POA: Diagnosis not present

## 2019-01-18 ENCOUNTER — Encounter: Payer: PPO | Admitting: Obstetrics and Gynecology

## 2019-04-04 ENCOUNTER — Other Ambulatory Visit: Payer: Self-pay | Admitting: Internal Medicine

## 2019-04-04 DIAGNOSIS — Z1231 Encounter for screening mammogram for malignant neoplasm of breast: Secondary | ICD-10-CM

## 2019-04-13 DIAGNOSIS — Z79899 Other long term (current) drug therapy: Secondary | ICD-10-CM | POA: Diagnosis not present

## 2019-04-13 DIAGNOSIS — E079 Disorder of thyroid, unspecified: Secondary | ICD-10-CM | POA: Diagnosis not present

## 2019-04-13 DIAGNOSIS — E7800 Pure hypercholesterolemia, unspecified: Secondary | ICD-10-CM | POA: Diagnosis not present

## 2019-04-15 DIAGNOSIS — L601 Onycholysis: Secondary | ICD-10-CM | POA: Diagnosis not present

## 2019-04-15 DIAGNOSIS — L298 Other pruritus: Secondary | ICD-10-CM | POA: Diagnosis not present

## 2019-04-15 DIAGNOSIS — Z85828 Personal history of other malignant neoplasm of skin: Secondary | ICD-10-CM | POA: Diagnosis not present

## 2019-04-15 DIAGNOSIS — L820 Inflamed seborrheic keratosis: Secondary | ICD-10-CM | POA: Diagnosis not present

## 2019-04-15 DIAGNOSIS — Z08 Encounter for follow-up examination after completed treatment for malignant neoplasm: Secondary | ICD-10-CM | POA: Diagnosis not present

## 2019-04-15 DIAGNOSIS — L538 Other specified erythematous conditions: Secondary | ICD-10-CM | POA: Diagnosis not present

## 2019-04-21 ENCOUNTER — Ambulatory Visit
Admission: RE | Admit: 2019-04-21 | Discharge: 2019-04-21 | Disposition: A | Discharge status: Home / Self Care | Payer: PPO | Source: Ambulatory Visit | Attending: Internal Medicine | Admitting: Internal Medicine

## 2019-04-21 ENCOUNTER — Encounter (INDEPENDENT_AMBULATORY_CARE_PROVIDER_SITE_OTHER): Payer: Self-pay

## 2019-04-21 ENCOUNTER — Other Ambulatory Visit: Payer: Self-pay

## 2019-04-21 DIAGNOSIS — Z1231 Encounter for screening mammogram for malignant neoplasm of breast: Secondary | ICD-10-CM | POA: Diagnosis not present

## 2019-04-25 ENCOUNTER — Other Ambulatory Visit: Payer: Self-pay | Admitting: Internal Medicine

## 2019-04-25 DIAGNOSIS — N6489 Other specified disorders of breast: Secondary | ICD-10-CM

## 2019-04-25 DIAGNOSIS — R928 Other abnormal and inconclusive findings on diagnostic imaging of breast: Secondary | ICD-10-CM

## 2019-04-26 DIAGNOSIS — M2011 Hallux valgus (acquired), right foot: Secondary | ICD-10-CM | POA: Diagnosis not present

## 2019-04-26 DIAGNOSIS — M21621 Bunionette of right foot: Secondary | ICD-10-CM | POA: Diagnosis not present

## 2019-04-26 DIAGNOSIS — M79671 Pain in right foot: Secondary | ICD-10-CM | POA: Diagnosis not present

## 2019-05-03 ENCOUNTER — Ambulatory Visit
Admission: RE | Admit: 2019-05-03 | Discharge: 2019-05-03 | Disposition: A | Discharge status: Home / Self Care | Payer: PPO | Source: Ambulatory Visit | Attending: Internal Medicine | Admitting: Internal Medicine

## 2019-05-03 ENCOUNTER — Other Ambulatory Visit: Payer: Self-pay

## 2019-05-03 DIAGNOSIS — N6489 Other specified disorders of breast: Secondary | ICD-10-CM | POA: Diagnosis not present

## 2019-05-03 DIAGNOSIS — R928 Other abnormal and inconclusive findings on diagnostic imaging of breast: Secondary | ICD-10-CM

## 2019-05-25 DIAGNOSIS — W010XXA Fall on same level from slipping, tripping and stumbling without subsequent striking against object, initial encounter: Secondary | ICD-10-CM | POA: Diagnosis not present

## 2019-05-25 DIAGNOSIS — S299XXA Unspecified injury of thorax, initial encounter: Secondary | ICD-10-CM | POA: Diagnosis not present

## 2019-05-25 DIAGNOSIS — S46002A Unspecified injury of muscle(s) and tendon(s) of the rotator cuff of left shoulder, initial encounter: Secondary | ICD-10-CM | POA: Diagnosis not present

## 2019-05-25 DIAGNOSIS — R0789 Other chest pain: Secondary | ICD-10-CM | POA: Diagnosis not present

## 2019-06-21 DIAGNOSIS — M810 Age-related osteoporosis without current pathological fracture: Secondary | ICD-10-CM | POA: Diagnosis not present

## 2019-06-21 DIAGNOSIS — E039 Hypothyroidism, unspecified: Secondary | ICD-10-CM | POA: Diagnosis not present

## 2019-06-30 DIAGNOSIS — K048 Radicular cyst: Secondary | ICD-10-CM | POA: Diagnosis not present

## 2019-08-16 DIAGNOSIS — E7800 Pure hypercholesterolemia, unspecified: Secondary | ICD-10-CM | POA: Diagnosis not present

## 2019-08-16 DIAGNOSIS — G720 Drug-induced myopathy: Secondary | ICD-10-CM | POA: Diagnosis not present

## 2019-08-16 DIAGNOSIS — T466X5A Adverse effect of antihyperlipidemic and antiarteriosclerotic drugs, initial encounter: Secondary | ICD-10-CM | POA: Diagnosis not present

## 2019-08-16 DIAGNOSIS — E079 Disorder of thyroid, unspecified: Secondary | ICD-10-CM | POA: Diagnosis not present

## 2019-08-16 DIAGNOSIS — I10 Essential (primary) hypertension: Secondary | ICD-10-CM | POA: Diagnosis not present

## 2019-08-16 DIAGNOSIS — Z79899 Other long term (current) drug therapy: Secondary | ICD-10-CM | POA: Diagnosis not present

## 2019-09-07 DIAGNOSIS — F329 Major depressive disorder, single episode, unspecified: Secondary | ICD-10-CM | POA: Diagnosis not present

## 2019-09-07 DIAGNOSIS — K219 Gastro-esophageal reflux disease without esophagitis: Secondary | ICD-10-CM | POA: Diagnosis not present

## 2019-09-07 DIAGNOSIS — E079 Disorder of thyroid, unspecified: Secondary | ICD-10-CM | POA: Diagnosis not present

## 2019-09-07 DIAGNOSIS — I10 Essential (primary) hypertension: Secondary | ICD-10-CM | POA: Diagnosis not present

## 2019-09-07 DIAGNOSIS — E7800 Pure hypercholesterolemia, unspecified: Secondary | ICD-10-CM | POA: Diagnosis not present

## 2019-12-14 DIAGNOSIS — E871 Hypo-osmolality and hyponatremia: Secondary | ICD-10-CM | POA: Diagnosis not present

## 2019-12-14 DIAGNOSIS — I10 Essential (primary) hypertension: Secondary | ICD-10-CM | POA: Diagnosis not present

## 2019-12-14 DIAGNOSIS — Z79899 Other long term (current) drug therapy: Secondary | ICD-10-CM | POA: Diagnosis not present

## 2019-12-14 DIAGNOSIS — Z1211 Encounter for screening for malignant neoplasm of colon: Secondary | ICD-10-CM | POA: Diagnosis not present

## 2019-12-14 DIAGNOSIS — T466X5A Adverse effect of antihyperlipidemic and antiarteriosclerotic drugs, initial encounter: Secondary | ICD-10-CM | POA: Diagnosis not present

## 2019-12-14 DIAGNOSIS — Z0000 Encounter for general adult medical examination without abnormal findings: Secondary | ICD-10-CM | POA: Diagnosis not present

## 2019-12-14 DIAGNOSIS — G720 Drug-induced myopathy: Secondary | ICD-10-CM | POA: Diagnosis not present

## 2019-12-14 DIAGNOSIS — E7800 Pure hypercholesterolemia, unspecified: Secondary | ICD-10-CM | POA: Diagnosis not present

## 2019-12-14 DIAGNOSIS — M810 Age-related osteoporosis without current pathological fracture: Secondary | ICD-10-CM | POA: Diagnosis not present

## 2019-12-14 DIAGNOSIS — R1013 Epigastric pain: Secondary | ICD-10-CM | POA: Diagnosis not present

## 2019-12-14 DIAGNOSIS — E079 Disorder of thyroid, unspecified: Secondary | ICD-10-CM | POA: Diagnosis not present

## 2019-12-19 DIAGNOSIS — T466X5A Adverse effect of antihyperlipidemic and antiarteriosclerotic drugs, initial encounter: Secondary | ICD-10-CM | POA: Diagnosis not present

## 2019-12-19 DIAGNOSIS — E7800 Pure hypercholesterolemia, unspecified: Secondary | ICD-10-CM | POA: Diagnosis not present

## 2019-12-19 DIAGNOSIS — E871 Hypo-osmolality and hyponatremia: Secondary | ICD-10-CM | POA: Diagnosis not present

## 2019-12-19 DIAGNOSIS — G720 Drug-induced myopathy: Secondary | ICD-10-CM | POA: Diagnosis not present

## 2019-12-19 DIAGNOSIS — Z79899 Other long term (current) drug therapy: Secondary | ICD-10-CM | POA: Diagnosis not present

## 2019-12-19 DIAGNOSIS — R1013 Epigastric pain: Secondary | ICD-10-CM | POA: Diagnosis not present

## 2019-12-19 DIAGNOSIS — Z0000 Encounter for general adult medical examination without abnormal findings: Secondary | ICD-10-CM | POA: Diagnosis not present

## 2019-12-19 DIAGNOSIS — I10 Essential (primary) hypertension: Secondary | ICD-10-CM | POA: Diagnosis not present

## 2019-12-19 DIAGNOSIS — Z1211 Encounter for screening for malignant neoplasm of colon: Secondary | ICD-10-CM | POA: Diagnosis not present

## 2019-12-19 DIAGNOSIS — M810 Age-related osteoporosis without current pathological fracture: Secondary | ICD-10-CM | POA: Diagnosis not present

## 2019-12-19 DIAGNOSIS — E079 Disorder of thyroid, unspecified: Secondary | ICD-10-CM | POA: Diagnosis not present

## 2019-12-28 DIAGNOSIS — M810 Age-related osteoporosis without current pathological fracture: Secondary | ICD-10-CM | POA: Diagnosis not present

## 2020-01-17 DIAGNOSIS — H16223 Keratoconjunctivitis sicca, not specified as Sjogren's, bilateral: Secondary | ICD-10-CM | POA: Diagnosis not present

## 2020-01-17 DIAGNOSIS — H5713 Ocular pain, bilateral: Secondary | ICD-10-CM | POA: Diagnosis not present

## 2020-02-22 DIAGNOSIS — M25472 Effusion, left ankle: Secondary | ICD-10-CM | POA: Diagnosis not present

## 2020-03-26 DIAGNOSIS — L309 Dermatitis, unspecified: Secondary | ICD-10-CM | POA: Diagnosis not present

## 2020-03-26 DIAGNOSIS — S50861A Insect bite (nonvenomous) of right forearm, initial encounter: Secondary | ICD-10-CM | POA: Diagnosis not present

## 2020-04-17 ENCOUNTER — Other Ambulatory Visit: Payer: Self-pay | Admitting: Internal Medicine

## 2020-04-17 DIAGNOSIS — E7800 Pure hypercholesterolemia, unspecified: Secondary | ICD-10-CM | POA: Diagnosis not present

## 2020-04-17 DIAGNOSIS — I10 Essential (primary) hypertension: Secondary | ICD-10-CM | POA: Diagnosis not present

## 2020-04-17 DIAGNOSIS — N952 Postmenopausal atrophic vaginitis: Secondary | ICD-10-CM | POA: Diagnosis not present

## 2020-04-17 DIAGNOSIS — G720 Drug-induced myopathy: Secondary | ICD-10-CM | POA: Diagnosis not present

## 2020-04-17 DIAGNOSIS — Z79899 Other long term (current) drug therapy: Secondary | ICD-10-CM | POA: Diagnosis not present

## 2020-04-17 DIAGNOSIS — T466X5A Adverse effect of antihyperlipidemic and antiarteriosclerotic drugs, initial encounter: Secondary | ICD-10-CM | POA: Diagnosis not present

## 2020-04-17 DIAGNOSIS — Z1231 Encounter for screening mammogram for malignant neoplasm of breast: Secondary | ICD-10-CM | POA: Diagnosis not present

## 2020-04-17 DIAGNOSIS — E079 Disorder of thyroid, unspecified: Secondary | ICD-10-CM | POA: Diagnosis not present

## 2020-04-17 DIAGNOSIS — K219 Gastro-esophageal reflux disease without esophagitis: Secondary | ICD-10-CM | POA: Diagnosis not present

## 2020-04-18 DIAGNOSIS — Z85828 Personal history of other malignant neoplasm of skin: Secondary | ICD-10-CM | POA: Diagnosis not present

## 2020-04-18 DIAGNOSIS — C4401 Basal cell carcinoma of skin of lip: Secondary | ICD-10-CM | POA: Diagnosis not present

## 2020-04-18 DIAGNOSIS — S70361A Insect bite (nonvenomous), right thigh, initial encounter: Secondary | ICD-10-CM | POA: Diagnosis not present

## 2020-04-18 DIAGNOSIS — D485 Neoplasm of uncertain behavior of skin: Secondary | ICD-10-CM | POA: Diagnosis not present

## 2020-04-18 DIAGNOSIS — D2271 Melanocytic nevi of right lower limb, including hip: Secondary | ICD-10-CM | POA: Diagnosis not present

## 2020-04-18 DIAGNOSIS — D2262 Melanocytic nevi of left upper limb, including shoulder: Secondary | ICD-10-CM | POA: Diagnosis not present

## 2020-04-18 DIAGNOSIS — D2272 Melanocytic nevi of left lower limb, including hip: Secondary | ICD-10-CM | POA: Diagnosis not present

## 2020-04-18 DIAGNOSIS — D225 Melanocytic nevi of trunk: Secondary | ICD-10-CM | POA: Diagnosis not present

## 2020-05-02 IMAGING — [facility] DIGITAL DIAGNOSTIC UNILATERAL LEFT MAMMOGRAM WITH TOMO AND CAD
4 series · 4 of 12 positions shown · non-contrast
Comparison: Previous exam(s).

CLINICAL DATA: Left upper breast asymmetry seen on most recent
screening mammography.

EXAM:
DIGITAL DIAGNOSTIC LEFT MAMMOGRAM WITH CAD AND TOMO
ULTRASOUND LEFT BREAST

[L ML synth-2D]
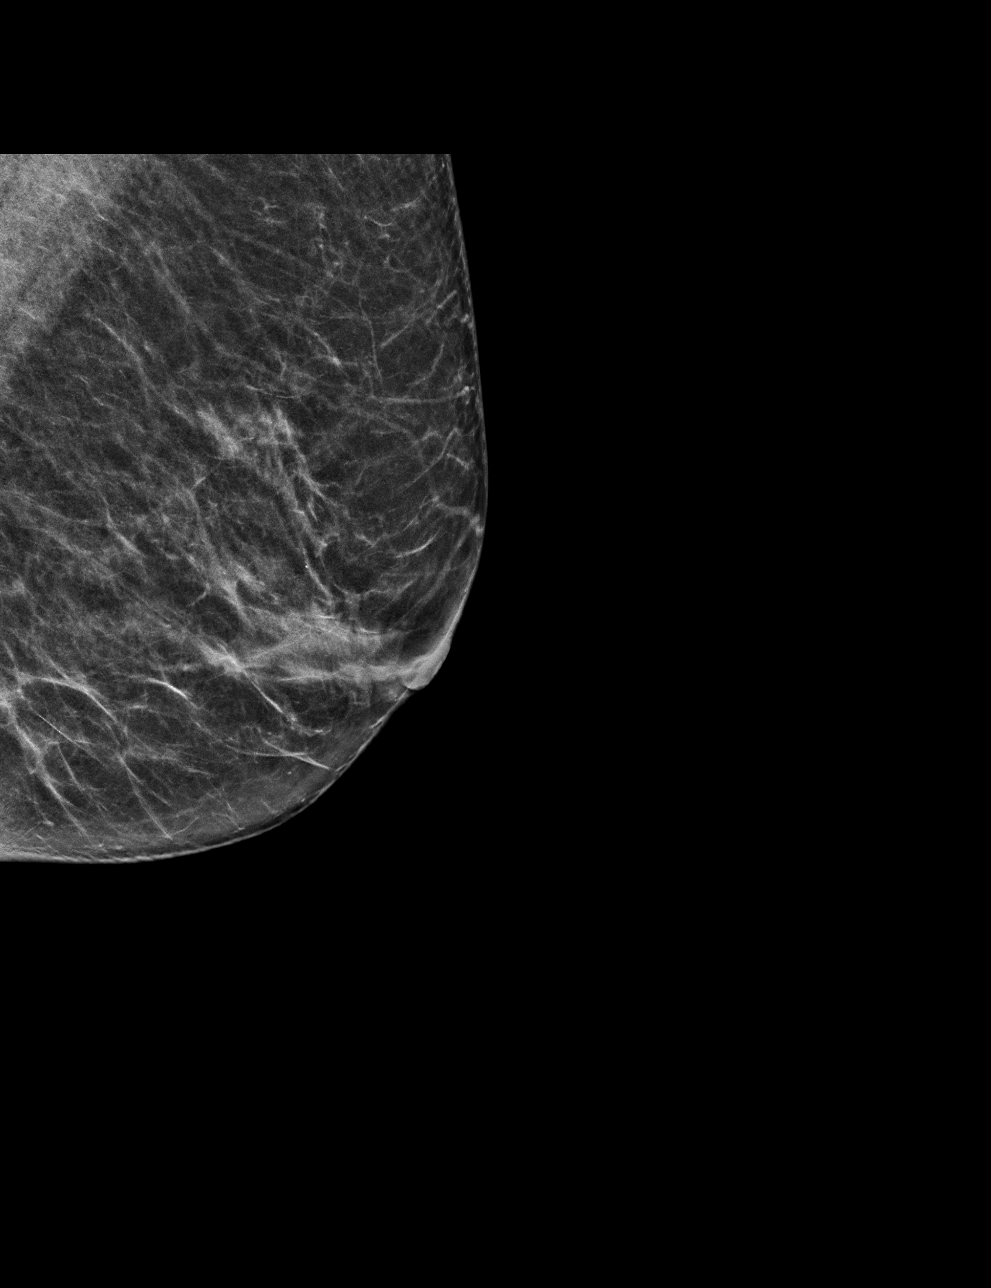

[L MLO synth-2D]
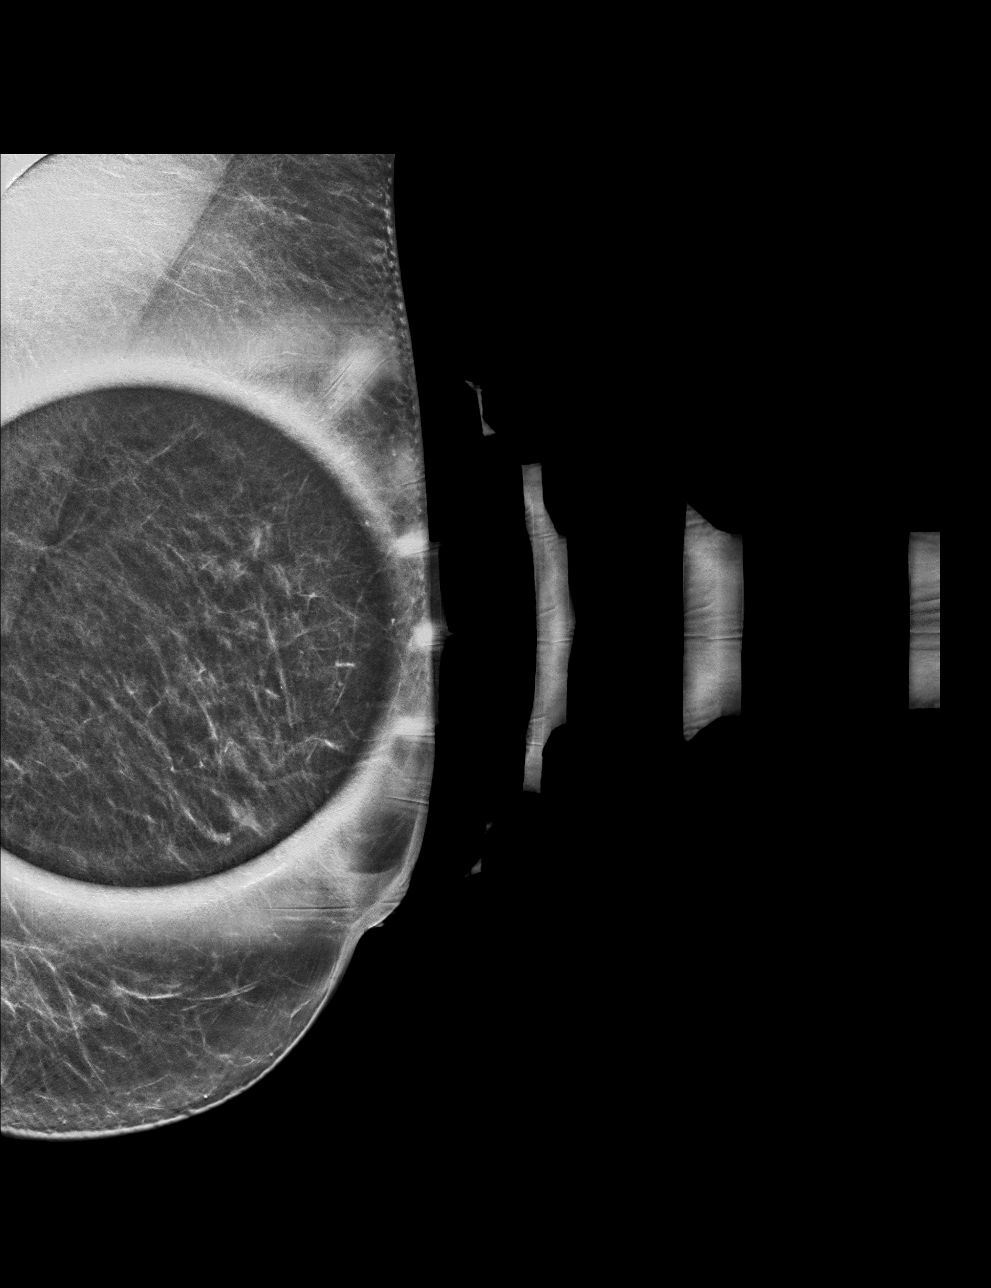

[L MLO tomo · tomo slice 27/52.0]
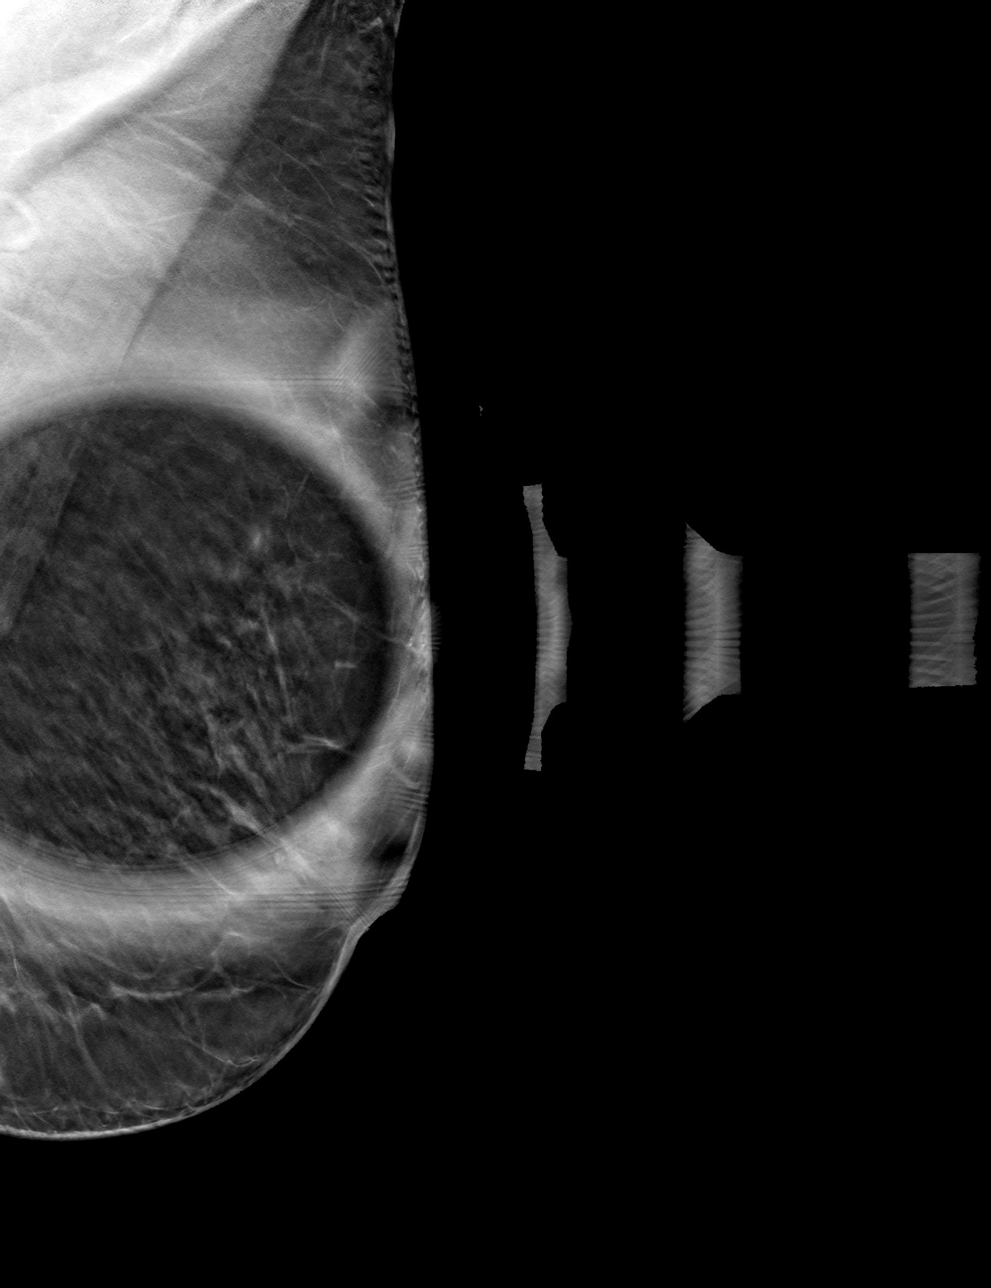

[L ML tomo · tomo slice 27/53.0]
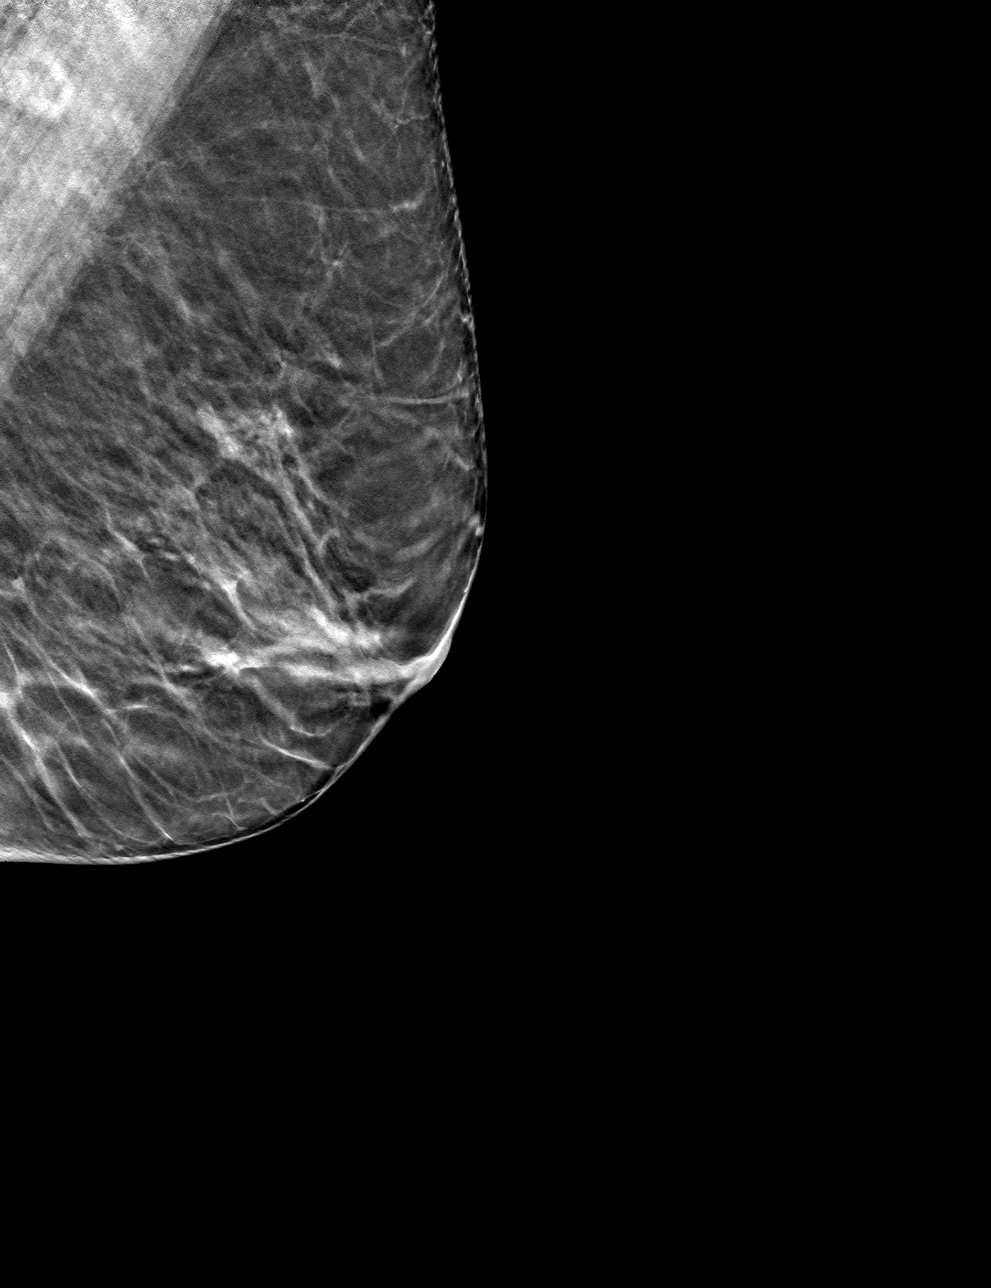

[4 of 12 positions shown; findings below may reference images not displayed]

ACR Breast Density Category b: There are scattered areas of
fibroglandular density.
FINDINGS: Additional mammographic views of the left breast demonstrate no
suspicious masses or areas of architectural distortion. The
previously questioned asymmetry in the upper left breast effaces to
glandular tissue.

Mammographic images were processed with CAD.

On physical exam, no suspicious masses are palpated.

Targeted ultrasound is performed, showing no suspicious masses or
shadowing lesions in the upper left breast.
IMPRESSION: No mammographic or sonographic evidence of malignancy in the left
breast.

RECOMMENDATION:
Screening mammogram in one year.(Code:[id number])

I have discussed the findings and recommendations with the patient.
Results were also provided in writing at the conclusion of the
visit. If applicable, a reminder letter will be sent to the patient
regarding the next appointment.

BI-RADS CATEGORY  1: Negative.

## 2020-05-16 DIAGNOSIS — D485 Neoplasm of uncertain behavior of skin: Secondary | ICD-10-CM | POA: Diagnosis not present

## 2020-05-16 DIAGNOSIS — C4401 Basal cell carcinoma of skin of lip: Secondary | ICD-10-CM | POA: Diagnosis not present

## 2020-05-25 DIAGNOSIS — C4401 Basal cell carcinoma of skin of lip: Secondary | ICD-10-CM | POA: Diagnosis not present

## 2020-07-03 DIAGNOSIS — M810 Age-related osteoporosis without current pathological fracture: Secondary | ICD-10-CM | POA: Diagnosis not present

## 2020-07-10 ENCOUNTER — Ambulatory Visit
Admission: RE | Admit: 2020-07-10 | Discharge: 2020-07-10 | Disposition: A | Discharge status: Home / Self Care | Payer: PPO | Source: Ambulatory Visit | Attending: Internal Medicine | Admitting: Internal Medicine

## 2020-07-10 ENCOUNTER — Other Ambulatory Visit: Payer: Self-pay

## 2020-07-10 DIAGNOSIS — Z1231 Encounter for screening mammogram for malignant neoplasm of breast: Secondary | ICD-10-CM | POA: Diagnosis not present

## 2020-08-03 DIAGNOSIS — L308 Other specified dermatitis: Secondary | ICD-10-CM | POA: Diagnosis not present

## 2020-08-03 DIAGNOSIS — B351 Tinea unguium: Secondary | ICD-10-CM | POA: Diagnosis not present

## 2020-08-03 DIAGNOSIS — L309 Dermatitis, unspecified: Secondary | ICD-10-CM | POA: Diagnosis not present

## 2020-08-20 DIAGNOSIS — Z79899 Other long term (current) drug therapy: Secondary | ICD-10-CM | POA: Diagnosis not present

## 2020-08-20 DIAGNOSIS — G720 Drug-induced myopathy: Secondary | ICD-10-CM | POA: Diagnosis not present

## 2020-08-20 DIAGNOSIS — M546 Pain in thoracic spine: Secondary | ICD-10-CM | POA: Diagnosis not present

## 2020-08-20 DIAGNOSIS — E7800 Pure hypercholesterolemia, unspecified: Secondary | ICD-10-CM | POA: Diagnosis not present

## 2020-08-20 DIAGNOSIS — M47814 Spondylosis without myelopathy or radiculopathy, thoracic region: Secondary | ICD-10-CM | POA: Diagnosis not present

## 2020-08-20 DIAGNOSIS — J90 Pleural effusion, not elsewhere classified: Secondary | ICD-10-CM | POA: Diagnosis not present

## 2020-08-20 DIAGNOSIS — E079 Disorder of thyroid, unspecified: Secondary | ICD-10-CM | POA: Diagnosis not present

## 2020-08-20 DIAGNOSIS — T466X5D Adverse effect of antihyperlipidemic and antiarteriosclerotic drugs, subsequent encounter: Secondary | ICD-10-CM | POA: Diagnosis not present

## 2020-08-20 DIAGNOSIS — I10 Essential (primary) hypertension: Secondary | ICD-10-CM | POA: Diagnosis not present

## 2020-08-20 DIAGNOSIS — S22080A Wedge compression fracture of T11-T12 vertebra, initial encounter for closed fracture: Secondary | ICD-10-CM | POA: Diagnosis not present

## 2020-08-22 DIAGNOSIS — L905 Scar conditions and fibrosis of skin: Secondary | ICD-10-CM | POA: Diagnosis not present

## 2020-08-22 DIAGNOSIS — L821 Other seborrheic keratosis: Secondary | ICD-10-CM | POA: Diagnosis not present

## 2020-09-12 DIAGNOSIS — E079 Disorder of thyroid, unspecified: Secondary | ICD-10-CM | POA: Diagnosis not present

## 2020-09-12 DIAGNOSIS — I208 Other forms of angina pectoris: Secondary | ICD-10-CM | POA: Diagnosis not present

## 2020-09-12 DIAGNOSIS — K219 Gastro-esophageal reflux disease without esophagitis: Secondary | ICD-10-CM | POA: Diagnosis not present

## 2020-09-12 DIAGNOSIS — R002 Palpitations: Secondary | ICD-10-CM | POA: Diagnosis not present

## 2020-09-12 DIAGNOSIS — I10 Essential (primary) hypertension: Secondary | ICD-10-CM | POA: Diagnosis not present

## 2020-09-12 DIAGNOSIS — R0602 Shortness of breath: Secondary | ICD-10-CM | POA: Diagnosis not present

## 2020-09-12 DIAGNOSIS — E782 Mixed hyperlipidemia: Secondary | ICD-10-CM | POA: Diagnosis not present

## 2020-09-20 DIAGNOSIS — E079 Disorder of thyroid, unspecified: Secondary | ICD-10-CM | POA: Diagnosis not present

## 2020-10-25 DIAGNOSIS — I208 Other forms of angina pectoris: Secondary | ICD-10-CM | POA: Diagnosis not present

## 2020-10-25 DIAGNOSIS — R0602 Shortness of breath: Secondary | ICD-10-CM | POA: Diagnosis not present

## 2020-11-06 DIAGNOSIS — R002 Palpitations: Secondary | ICD-10-CM | POA: Diagnosis not present

## 2020-11-06 DIAGNOSIS — J329 Chronic sinusitis, unspecified: Secondary | ICD-10-CM | POA: Diagnosis not present

## 2020-11-06 DIAGNOSIS — E782 Mixed hyperlipidemia: Secondary | ICD-10-CM | POA: Diagnosis not present

## 2020-11-06 DIAGNOSIS — I10 Essential (primary) hypertension: Secondary | ICD-10-CM | POA: Diagnosis not present

## 2020-11-06 DIAGNOSIS — R0602 Shortness of breath: Secondary | ICD-10-CM | POA: Diagnosis not present

## 2020-11-06 DIAGNOSIS — I208 Other forms of angina pectoris: Secondary | ICD-10-CM | POA: Diagnosis not present

## 2020-11-06 DIAGNOSIS — K219 Gastro-esophageal reflux disease without esophagitis: Secondary | ICD-10-CM | POA: Diagnosis not present

## 2020-12-20 DIAGNOSIS — E039 Hypothyroidism, unspecified: Secondary | ICD-10-CM | POA: Diagnosis not present

## 2020-12-20 DIAGNOSIS — M810 Age-related osteoporosis without current pathological fracture: Secondary | ICD-10-CM | POA: Diagnosis not present

## 2021-01-09 DIAGNOSIS — M810 Age-related osteoporosis without current pathological fracture: Secondary | ICD-10-CM | POA: Diagnosis not present

## 2021-01-21 DIAGNOSIS — Z961 Presence of intraocular lens: Secondary | ICD-10-CM | POA: Diagnosis not present

## 2021-02-01 ENCOUNTER — Other Ambulatory Visit: Payer: Self-pay | Admitting: Internal Medicine

## 2021-02-01 DIAGNOSIS — I10 Essential (primary) hypertension: Secondary | ICD-10-CM | POA: Diagnosis not present

## 2021-02-01 DIAGNOSIS — E7800 Pure hypercholesterolemia, unspecified: Secondary | ICD-10-CM | POA: Diagnosis not present

## 2021-02-01 DIAGNOSIS — Z79899 Other long term (current) drug therapy: Secondary | ICD-10-CM | POA: Diagnosis not present

## 2021-02-01 DIAGNOSIS — G720 Drug-induced myopathy: Secondary | ICD-10-CM | POA: Diagnosis not present

## 2021-02-01 DIAGNOSIS — Z0000 Encounter for general adult medical examination without abnormal findings: Secondary | ICD-10-CM | POA: Diagnosis not present

## 2021-02-01 DIAGNOSIS — E871 Hypo-osmolality and hyponatremia: Secondary | ICD-10-CM | POA: Diagnosis not present

## 2021-02-01 DIAGNOSIS — E079 Disorder of thyroid, unspecified: Secondary | ICD-10-CM | POA: Diagnosis not present

## 2021-02-01 DIAGNOSIS — T466X5A Adverse effect of antihyperlipidemic and antiarteriosclerotic drugs, initial encounter: Secondary | ICD-10-CM | POA: Diagnosis not present

## 2021-02-01 DIAGNOSIS — R4182 Altered mental status, unspecified: Secondary | ICD-10-CM

## 2021-03-01 DIAGNOSIS — E079 Disorder of thyroid, unspecified: Secondary | ICD-10-CM | POA: Diagnosis not present

## 2021-03-01 DIAGNOSIS — Z79899 Other long term (current) drug therapy: Secondary | ICD-10-CM | POA: Diagnosis not present

## 2021-03-01 DIAGNOSIS — E7800 Pure hypercholesterolemia, unspecified: Secondary | ICD-10-CM | POA: Diagnosis not present

## 2021-03-01 DIAGNOSIS — I10 Essential (primary) hypertension: Secondary | ICD-10-CM | POA: Diagnosis not present

## 2021-03-01 DIAGNOSIS — R4182 Altered mental status, unspecified: Secondary | ICD-10-CM | POA: Diagnosis not present

## 2021-03-12 ENCOUNTER — Other Ambulatory Visit: Payer: Self-pay

## 2021-03-12 ENCOUNTER — Ambulatory Visit
Admission: RE | Admit: 2021-03-12 | Discharge: 2021-03-12 | Disposition: A | Discharge status: Home / Self Care | Payer: PPO | Source: Ambulatory Visit | Attending: Internal Medicine | Admitting: Internal Medicine

## 2021-03-12 DIAGNOSIS — R4182 Altered mental status, unspecified: Secondary | ICD-10-CM | POA: Diagnosis not present

## 2021-03-12 DIAGNOSIS — R413 Other amnesia: Secondary | ICD-10-CM | POA: Diagnosis not present

## 2021-04-18 DIAGNOSIS — D2261 Melanocytic nevi of right upper limb, including shoulder: Secondary | ICD-10-CM | POA: Diagnosis not present

## 2021-04-18 DIAGNOSIS — L570 Actinic keratosis: Secondary | ICD-10-CM | POA: Diagnosis not present

## 2021-04-18 DIAGNOSIS — D2262 Melanocytic nevi of left upper limb, including shoulder: Secondary | ICD-10-CM | POA: Diagnosis not present

## 2021-04-18 DIAGNOSIS — S30860A Insect bite (nonvenomous) of lower back and pelvis, initial encounter: Secondary | ICD-10-CM | POA: Diagnosis not present

## 2021-04-18 DIAGNOSIS — D225 Melanocytic nevi of trunk: Secondary | ICD-10-CM | POA: Diagnosis not present

## 2021-04-18 DIAGNOSIS — D2271 Melanocytic nevi of right lower limb, including hip: Secondary | ICD-10-CM | POA: Diagnosis not present

## 2021-04-18 DIAGNOSIS — Z85828 Personal history of other malignant neoplasm of skin: Secondary | ICD-10-CM | POA: Diagnosis not present

## 2021-04-18 DIAGNOSIS — L821 Other seborrheic keratosis: Secondary | ICD-10-CM | POA: Diagnosis not present

## 2021-05-16 DIAGNOSIS — E079 Disorder of thyroid, unspecified: Secondary | ICD-10-CM | POA: Diagnosis not present

## 2021-05-16 DIAGNOSIS — T466X5A Adverse effect of antihyperlipidemic and antiarteriosclerotic drugs, initial encounter: Secondary | ICD-10-CM | POA: Diagnosis not present

## 2021-05-16 DIAGNOSIS — E782 Mixed hyperlipidemia: Secondary | ICD-10-CM | POA: Diagnosis not present

## 2021-05-16 DIAGNOSIS — Z1231 Encounter for screening mammogram for malignant neoplasm of breast: Secondary | ICD-10-CM | POA: Diagnosis not present

## 2021-05-16 DIAGNOSIS — I10 Essential (primary) hypertension: Secondary | ICD-10-CM | POA: Diagnosis not present

## 2021-05-16 DIAGNOSIS — D126 Benign neoplasm of colon, unspecified: Secondary | ICD-10-CM | POA: Diagnosis not present

## 2021-05-16 DIAGNOSIS — Z79899 Other long term (current) drug therapy: Secondary | ICD-10-CM | POA: Diagnosis not present

## 2021-05-16 DIAGNOSIS — G720 Drug-induced myopathy: Secondary | ICD-10-CM | POA: Diagnosis not present

## 2021-05-20 DIAGNOSIS — Z85828 Personal history of other malignant neoplasm of skin: Secondary | ICD-10-CM | POA: Diagnosis not present

## 2021-05-20 DIAGNOSIS — L905 Scar conditions and fibrosis of skin: Secondary | ICD-10-CM | POA: Diagnosis not present

## 2021-05-20 DIAGNOSIS — L814 Other melanin hyperpigmentation: Secondary | ICD-10-CM | POA: Diagnosis not present

## 2021-05-23 DIAGNOSIS — R0609 Other forms of dyspnea: Secondary | ICD-10-CM | POA: Diagnosis not present

## 2021-05-23 DIAGNOSIS — E782 Mixed hyperlipidemia: Secondary | ICD-10-CM | POA: Diagnosis not present

## 2021-05-23 DIAGNOSIS — R002 Palpitations: Secondary | ICD-10-CM | POA: Diagnosis not present

## 2021-05-23 DIAGNOSIS — I208 Other forms of angina pectoris: Secondary | ICD-10-CM | POA: Diagnosis not present

## 2021-05-23 DIAGNOSIS — E079 Disorder of thyroid, unspecified: Secondary | ICD-10-CM | POA: Diagnosis not present

## 2021-05-23 DIAGNOSIS — M25471 Effusion, right ankle: Secondary | ICD-10-CM | POA: Diagnosis not present

## 2021-05-23 DIAGNOSIS — I10 Essential (primary) hypertension: Secondary | ICD-10-CM | POA: Diagnosis not present

## 2021-05-23 DIAGNOSIS — K219 Gastro-esophageal reflux disease without esophagitis: Secondary | ICD-10-CM | POA: Diagnosis not present

## 2021-05-23 DIAGNOSIS — M25472 Effusion, left ankle: Secondary | ICD-10-CM | POA: Diagnosis not present

## 2021-07-10 IMAGING — MG DIGITAL SCREENING BILAT W/ TOMO W/ CAD
8 series · 8 of 24 positions shown · non-contrast
Comparison: Previous exam(s).

CLINICAL DATA: Screening.

EXAM:
DIGITAL SCREENING BILATERAL MAMMOGRAM WITH TOMO AND CAD

[L MLO synth-2D]
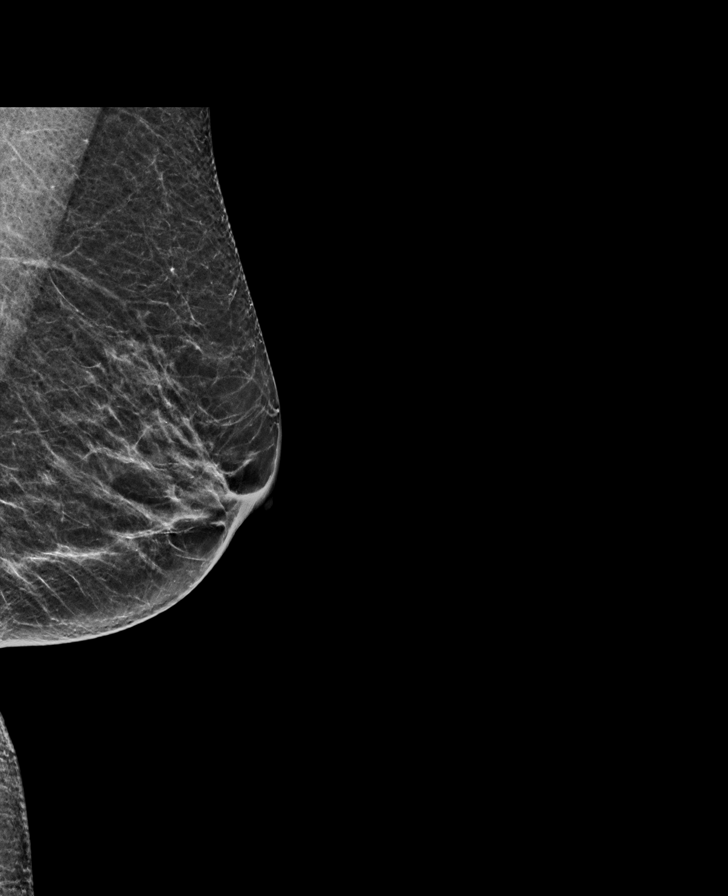

[L CC synth-2D]
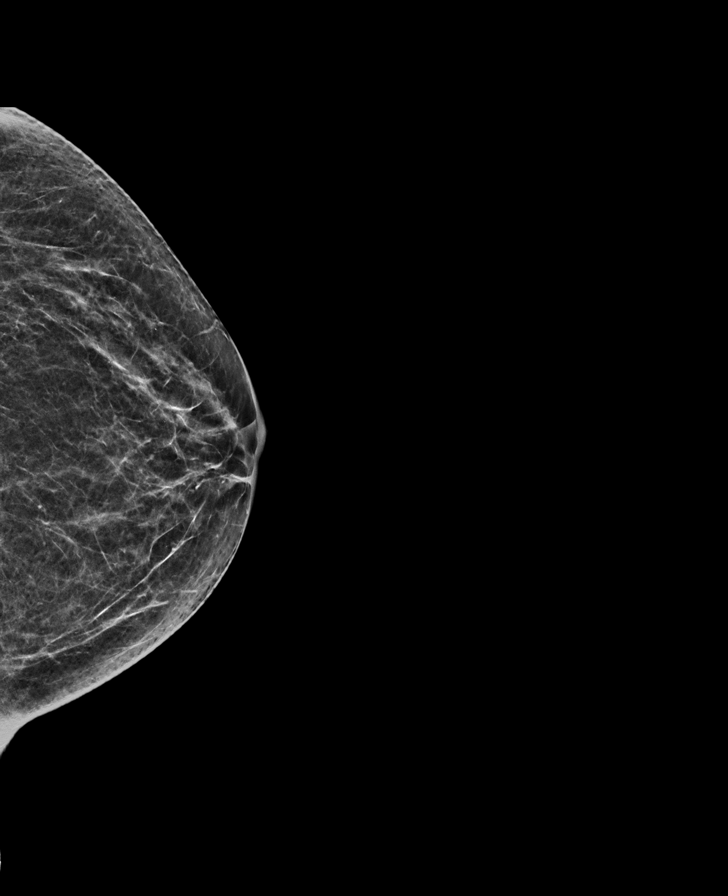

[R CC synth-2D]
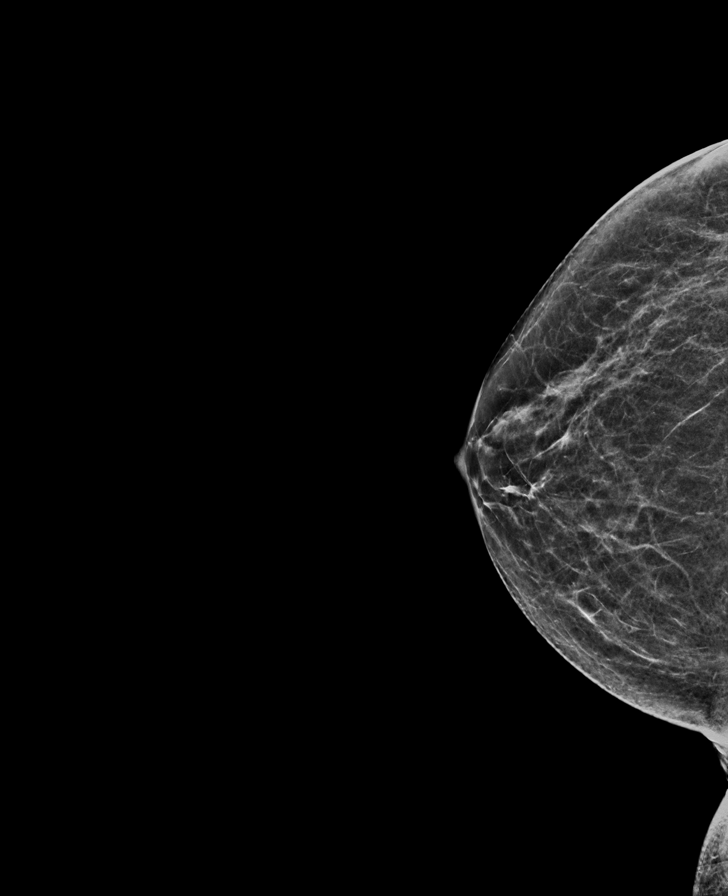

[R MLO synth-2D]
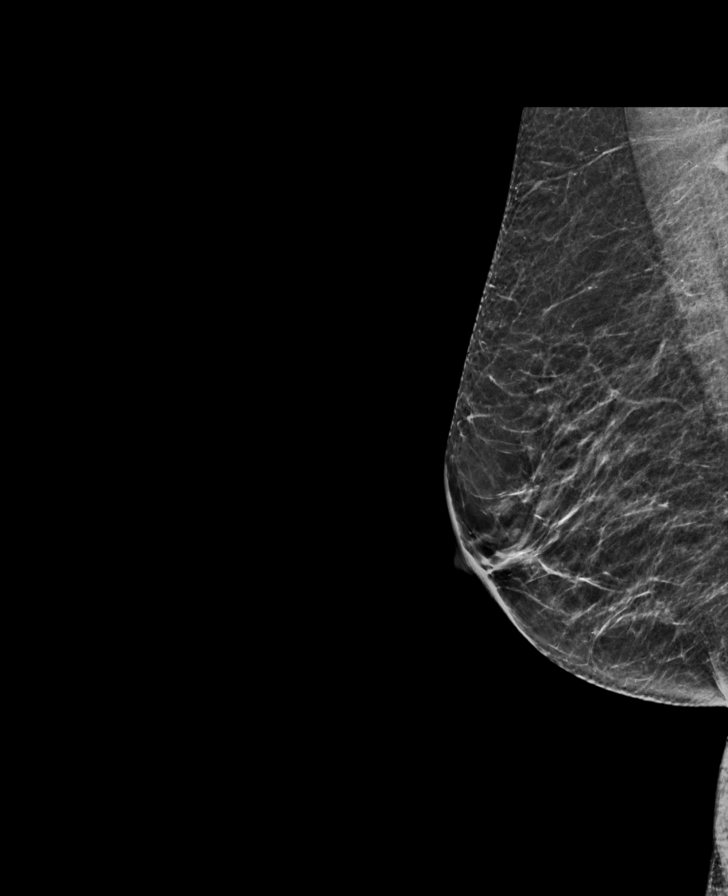

[R CC tomo · tomo slice 29/56.0]
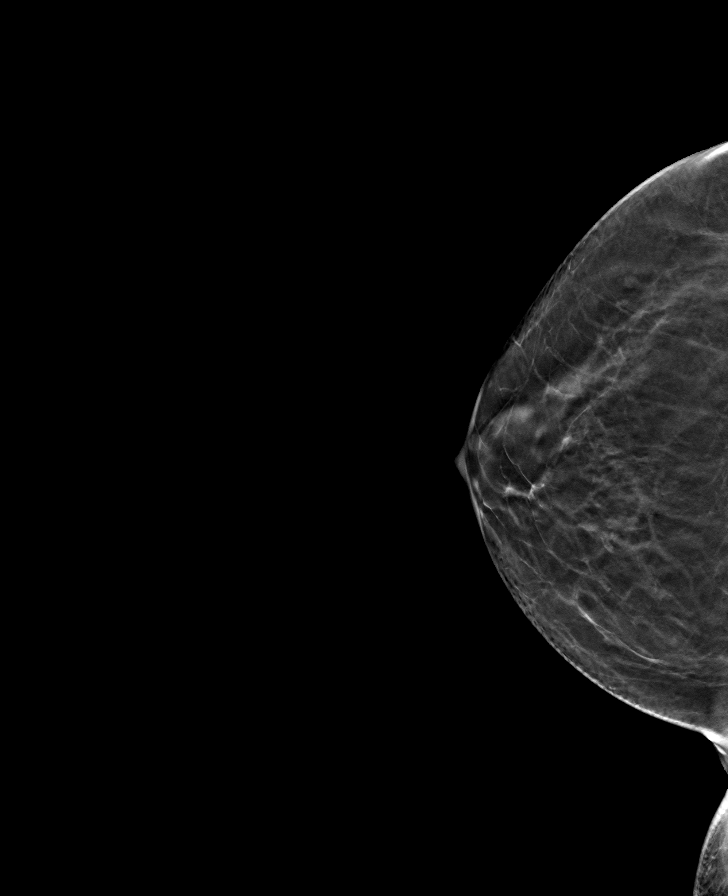

[R MLO tomo · tomo slice 28/55.0]
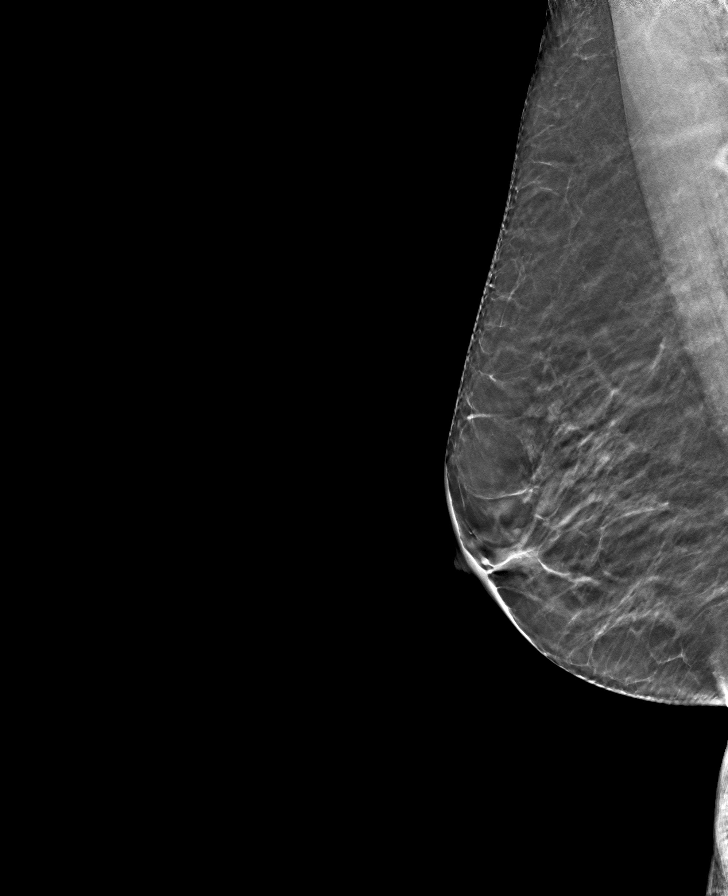

[L CC tomo · tomo slice 27/54.0]
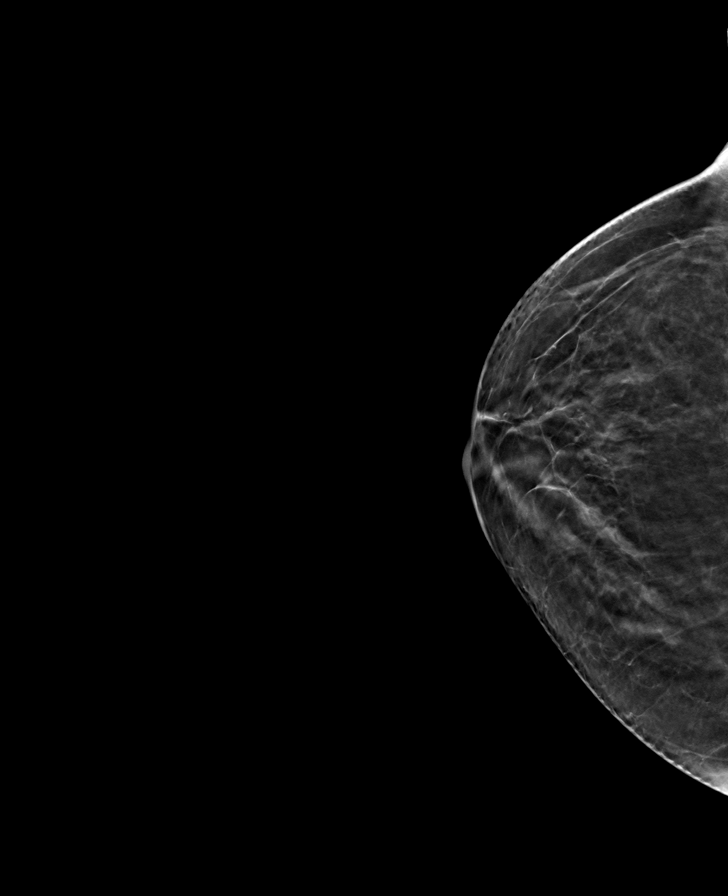

[L MLO tomo · tomo slice 27/53.0]
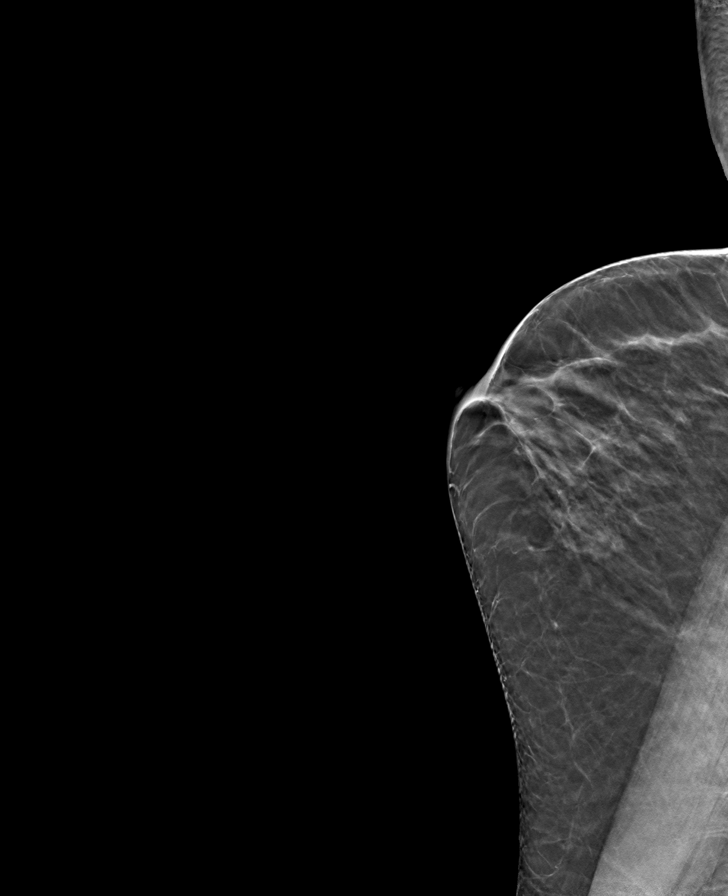

[8 of 24 positions shown; findings below may reference images not displayed]

ACR Breast Density Category b: There are scattered areas of
fibroglandular density.
FINDINGS: There are no findings suspicious for malignancy. Images were
processed with CAD.
IMPRESSION: No mammographic evidence of malignancy. A result letter of this
screening mammogram will be mailed directly to the patient.

RECOMMENDATION:
Screening mammogram in one year. (Code:[id number])

BI-RADS CATEGORY  1: Negative.

## 2021-07-17 ENCOUNTER — Other Ambulatory Visit: Payer: Self-pay | Admitting: Internal Medicine

## 2021-07-17 DIAGNOSIS — Z1231 Encounter for screening mammogram for malignant neoplasm of breast: Secondary | ICD-10-CM

## 2021-07-31 DIAGNOSIS — R1013 Epigastric pain: Secondary | ICD-10-CM | POA: Diagnosis not present

## 2021-07-31 DIAGNOSIS — K219 Gastro-esophageal reflux disease without esophagitis: Secondary | ICD-10-CM | POA: Diagnosis not present

## 2021-07-31 DIAGNOSIS — Z86010 Personal history of colonic polyps: Secondary | ICD-10-CM | POA: Diagnosis not present

## 2021-08-06 ENCOUNTER — Ambulatory Visit
Admission: RE | Admit: 2021-08-06 | Discharge: 2021-08-06 | Disposition: A | Discharge status: Home / Self Care | Payer: PPO | Source: Ambulatory Visit | Attending: Internal Medicine | Admitting: Internal Medicine

## 2021-08-06 ENCOUNTER — Other Ambulatory Visit: Payer: Self-pay

## 2021-08-06 DIAGNOSIS — Z1231 Encounter for screening mammogram for malignant neoplasm of breast: Secondary | ICD-10-CM | POA: Diagnosis not present

## 2021-08-07 DIAGNOSIS — Z79899 Other long term (current) drug therapy: Secondary | ICD-10-CM | POA: Diagnosis not present

## 2021-08-07 DIAGNOSIS — E782 Mixed hyperlipidemia: Secondary | ICD-10-CM | POA: Diagnosis not present

## 2021-08-07 DIAGNOSIS — E079 Disorder of thyroid, unspecified: Secondary | ICD-10-CM | POA: Diagnosis not present

## 2021-08-07 DIAGNOSIS — I10 Essential (primary) hypertension: Secondary | ICD-10-CM | POA: Diagnosis not present

## 2021-08-14 DIAGNOSIS — T466X5A Adverse effect of antihyperlipidemic and antiarteriosclerotic drugs, initial encounter: Secondary | ICD-10-CM | POA: Diagnosis not present

## 2021-08-14 DIAGNOSIS — E782 Mixed hyperlipidemia: Secondary | ICD-10-CM | POA: Diagnosis not present

## 2021-08-14 DIAGNOSIS — I10 Essential (primary) hypertension: Secondary | ICD-10-CM | POA: Diagnosis not present

## 2021-08-14 DIAGNOSIS — M810 Age-related osteoporosis without current pathological fracture: Secondary | ICD-10-CM | POA: Diagnosis not present

## 2021-08-14 DIAGNOSIS — E871 Hypo-osmolality and hyponatremia: Secondary | ICD-10-CM | POA: Diagnosis not present

## 2021-08-14 DIAGNOSIS — E079 Disorder of thyroid, unspecified: Secondary | ICD-10-CM | POA: Diagnosis not present

## 2021-08-14 DIAGNOSIS — G720 Drug-induced myopathy: Secondary | ICD-10-CM | POA: Diagnosis not present

## 2021-10-22 DIAGNOSIS — Z86010 Personal history of colonic polyps: Secondary | ICD-10-CM | POA: Diagnosis not present

## 2021-10-22 DIAGNOSIS — K2970 Gastritis, unspecified, without bleeding: Secondary | ICD-10-CM | POA: Diagnosis not present

## 2021-10-22 DIAGNOSIS — K5730 Diverticulosis of large intestine without perforation or abscess without bleeding: Secondary | ICD-10-CM | POA: Diagnosis not present

## 2021-10-22 DIAGNOSIS — K640 First degree hemorrhoids: Secondary | ICD-10-CM | POA: Diagnosis not present

## 2021-10-22 DIAGNOSIS — K449 Diaphragmatic hernia without obstruction or gangrene: Secondary | ICD-10-CM | POA: Diagnosis not present

## 2021-10-22 DIAGNOSIS — K219 Gastro-esophageal reflux disease without esophagitis: Secondary | ICD-10-CM | POA: Diagnosis not present

## 2021-11-14 DIAGNOSIS — E782 Mixed hyperlipidemia: Secondary | ICD-10-CM | POA: Diagnosis not present

## 2021-11-14 DIAGNOSIS — M810 Age-related osteoporosis without current pathological fracture: Secondary | ICD-10-CM | POA: Diagnosis not present

## 2021-11-14 DIAGNOSIS — E079 Disorder of thyroid, unspecified: Secondary | ICD-10-CM | POA: Diagnosis not present

## 2021-11-14 DIAGNOSIS — I10 Essential (primary) hypertension: Secondary | ICD-10-CM | POA: Diagnosis not present

## 2021-11-14 DIAGNOSIS — G720 Drug-induced myopathy: Secondary | ICD-10-CM | POA: Diagnosis not present

## 2021-11-14 DIAGNOSIS — T466X5A Adverse effect of antihyperlipidemic and antiarteriosclerotic drugs, initial encounter: Secondary | ICD-10-CM | POA: Diagnosis not present

## 2021-11-14 DIAGNOSIS — Z79899 Other long term (current) drug therapy: Secondary | ICD-10-CM | POA: Diagnosis not present

## 2021-11-14 DIAGNOSIS — Z0000 Encounter for general adult medical examination without abnormal findings: Secondary | ICD-10-CM | POA: Diagnosis not present

## 2021-12-02 DIAGNOSIS — M25472 Effusion, left ankle: Secondary | ICD-10-CM | POA: Diagnosis not present

## 2021-12-02 DIAGNOSIS — R002 Palpitations: Secondary | ICD-10-CM | POA: Diagnosis not present

## 2021-12-02 DIAGNOSIS — E782 Mixed hyperlipidemia: Secondary | ICD-10-CM | POA: Diagnosis not present

## 2021-12-02 DIAGNOSIS — R0609 Other forms of dyspnea: Secondary | ICD-10-CM | POA: Diagnosis not present

## 2021-12-02 DIAGNOSIS — M25471 Effusion, right ankle: Secondary | ICD-10-CM | POA: Diagnosis not present

## 2021-12-02 DIAGNOSIS — I208 Other forms of angina pectoris: Secondary | ICD-10-CM | POA: Diagnosis not present

## 2021-12-02 DIAGNOSIS — I10 Essential (primary) hypertension: Secondary | ICD-10-CM | POA: Diagnosis not present

## 2021-12-24 DIAGNOSIS — E039 Hypothyroidism, unspecified: Secondary | ICD-10-CM | POA: Diagnosis not present

## 2021-12-24 DIAGNOSIS — M810 Age-related osteoporosis without current pathological fracture: Secondary | ICD-10-CM | POA: Diagnosis not present

## 2021-12-26 DIAGNOSIS — Z79899 Other long term (current) drug therapy: Secondary | ICD-10-CM | POA: Diagnosis not present

## 2021-12-26 DIAGNOSIS — R059 Cough, unspecified: Secondary | ICD-10-CM | POA: Diagnosis not present

## 2021-12-26 DIAGNOSIS — R5383 Other fatigue: Secondary | ICD-10-CM | POA: Diagnosis not present

## 2021-12-26 DIAGNOSIS — R053 Chronic cough: Secondary | ICD-10-CM | POA: Diagnosis not present

## 2021-12-26 DIAGNOSIS — E079 Disorder of thyroid, unspecified: Secondary | ICD-10-CM | POA: Diagnosis not present

## 2021-12-26 DIAGNOSIS — R5381 Other malaise: Secondary | ICD-10-CM | POA: Diagnosis not present

## 2022-01-01 DIAGNOSIS — E782 Mixed hyperlipidemia: Secondary | ICD-10-CM | POA: Diagnosis not present

## 2022-01-01 DIAGNOSIS — I208 Other forms of angina pectoris: Secondary | ICD-10-CM | POA: Diagnosis not present

## 2022-01-01 DIAGNOSIS — R000 Tachycardia, unspecified: Secondary | ICD-10-CM | POA: Diagnosis not present

## 2022-01-01 DIAGNOSIS — R5381 Other malaise: Secondary | ICD-10-CM | POA: Diagnosis not present

## 2022-01-01 DIAGNOSIS — M25472 Effusion, left ankle: Secondary | ICD-10-CM | POA: Diagnosis not present

## 2022-01-01 DIAGNOSIS — R5383 Other fatigue: Secondary | ICD-10-CM | POA: Diagnosis not present

## 2022-01-01 DIAGNOSIS — I10 Essential (primary) hypertension: Secondary | ICD-10-CM | POA: Diagnosis not present

## 2022-01-01 DIAGNOSIS — R0609 Other forms of dyspnea: Secondary | ICD-10-CM | POA: Diagnosis not present

## 2022-01-01 DIAGNOSIS — M25471 Effusion, right ankle: Secondary | ICD-10-CM | POA: Diagnosis not present

## 2022-01-07 DIAGNOSIS — I10 Essential (primary) hypertension: Secondary | ICD-10-CM | POA: Diagnosis not present

## 2022-01-07 DIAGNOSIS — K2900 Acute gastritis without bleeding: Secondary | ICD-10-CM | POA: Diagnosis not present

## 2022-01-07 DIAGNOSIS — E079 Disorder of thyroid, unspecified: Secondary | ICD-10-CM | POA: Diagnosis not present

## 2022-01-22 DIAGNOSIS — Z961 Presence of intraocular lens: Secondary | ICD-10-CM | POA: Diagnosis not present

## 2022-02-18 DIAGNOSIS — Z79899 Other long term (current) drug therapy: Secondary | ICD-10-CM | POA: Diagnosis not present

## 2022-02-18 DIAGNOSIS — M810 Age-related osteoporosis without current pathological fracture: Secondary | ICD-10-CM | POA: Diagnosis not present

## 2022-02-18 DIAGNOSIS — E079 Disorder of thyroid, unspecified: Secondary | ICD-10-CM | POA: Diagnosis not present

## 2022-02-18 DIAGNOSIS — E782 Mixed hyperlipidemia: Secondary | ICD-10-CM | POA: Diagnosis not present

## 2022-02-25 ENCOUNTER — Other Ambulatory Visit: Payer: Self-pay | Admitting: Internal Medicine

## 2022-02-25 DIAGNOSIS — E079 Disorder of thyroid, unspecified: Secondary | ICD-10-CM | POA: Diagnosis not present

## 2022-02-25 DIAGNOSIS — G720 Drug-induced myopathy: Secondary | ICD-10-CM | POA: Diagnosis not present

## 2022-02-25 DIAGNOSIS — R053 Chronic cough: Secondary | ICD-10-CM | POA: Diagnosis not present

## 2022-02-25 DIAGNOSIS — T466X5A Adverse effect of antihyperlipidemic and antiarteriosclerotic drugs, initial encounter: Secondary | ICD-10-CM | POA: Diagnosis not present

## 2022-02-25 DIAGNOSIS — R0789 Other chest pain: Secondary | ICD-10-CM | POA: Diagnosis not present

## 2022-02-25 DIAGNOSIS — E782 Mixed hyperlipidemia: Secondary | ICD-10-CM | POA: Diagnosis not present

## 2022-02-25 DIAGNOSIS — Z0000 Encounter for general adult medical examination without abnormal findings: Secondary | ICD-10-CM | POA: Diagnosis not present

## 2022-02-25 DIAGNOSIS — I10 Essential (primary) hypertension: Secondary | ICD-10-CM | POA: Diagnosis not present

## 2022-03-05 ENCOUNTER — Ambulatory Visit
Admission: RE | Admit: 2022-03-05 | Discharge: 2022-03-05 | Disposition: A | Discharge status: Home / Self Care | Payer: PPO | Source: Ambulatory Visit | Attending: Internal Medicine | Admitting: Internal Medicine

## 2022-03-05 DIAGNOSIS — R053 Chronic cough: Secondary | ICD-10-CM

## 2022-03-05 DIAGNOSIS — R079 Chest pain, unspecified: Secondary | ICD-10-CM | POA: Diagnosis not present

## 2022-03-05 DIAGNOSIS — R0789 Other chest pain: Secondary | ICD-10-CM

## 2022-03-05 DIAGNOSIS — R918 Other nonspecific abnormal finding of lung field: Secondary | ICD-10-CM | POA: Diagnosis not present

## 2022-03-05 DIAGNOSIS — R911 Solitary pulmonary nodule: Secondary | ICD-10-CM | POA: Diagnosis not present

## 2022-03-05 DIAGNOSIS — I3139 Other pericardial effusion (noninflammatory): Secondary | ICD-10-CM | POA: Diagnosis not present

## 2022-03-18 DIAGNOSIS — R070 Pain in throat: Secondary | ICD-10-CM | POA: Diagnosis not present

## 2022-03-18 DIAGNOSIS — K219 Gastro-esophageal reflux disease without esophagitis: Secondary | ICD-10-CM | POA: Diagnosis not present

## 2022-03-18 DIAGNOSIS — R9389 Abnormal findings on diagnostic imaging of other specified body structures: Secondary | ICD-10-CM | POA: Diagnosis not present

## 2022-03-21 ENCOUNTER — Other Ambulatory Visit
Admission: RE | Admit: 2022-03-21 | Discharge: 2022-03-21 | Disposition: A | Discharge status: Home / Self Care | Payer: PPO | Source: Ambulatory Visit | Attending: Pulmonary Disease | Admitting: Pulmonary Disease

## 2022-03-21 DIAGNOSIS — K219 Gastro-esophageal reflux disease without esophagitis: Secondary | ICD-10-CM | POA: Diagnosis not present

## 2022-03-21 DIAGNOSIS — J4540 Moderate persistent asthma, uncomplicated: Secondary | ICD-10-CM | POA: Diagnosis not present

## 2022-03-21 DIAGNOSIS — R0602 Shortness of breath: Secondary | ICD-10-CM | POA: Insufficient documentation

## 2022-03-21 DIAGNOSIS — R0789 Other chest pain: Secondary | ICD-10-CM | POA: Diagnosis not present

## 2022-03-21 LAB — D-DIMER, QUANTITATIVE: D-Dimer, Quant: 0.45 ug/mL-FEU (ref 0.00–0.50)

## 2022-03-26 ENCOUNTER — Other Ambulatory Visit: Payer: Self-pay | Admitting: Pulmonary Disease

## 2022-03-26 DIAGNOSIS — R0602 Shortness of breath: Secondary | ICD-10-CM

## 2022-03-26 DIAGNOSIS — K219 Gastro-esophageal reflux disease without esophagitis: Secondary | ICD-10-CM

## 2022-03-31 ENCOUNTER — Other Ambulatory Visit (HOSPITAL_COMMUNITY): Payer: PPO

## 2022-04-01 ENCOUNTER — Ambulatory Visit
Admission: RE | Admit: 2022-04-01 | Discharge: 2022-04-01 | Disposition: A | Discharge status: Home / Self Care | Payer: PPO | Source: Ambulatory Visit | Attending: Pulmonary Disease | Admitting: Pulmonary Disease

## 2022-04-01 DIAGNOSIS — K219 Gastro-esophageal reflux disease without esophagitis: Secondary | ICD-10-CM | POA: Insufficient documentation

## 2022-04-01 DIAGNOSIS — R0602 Shortness of breath: Secondary | ICD-10-CM | POA: Diagnosis not present

## 2022-04-01 DIAGNOSIS — K224 Dyskinesia of esophagus: Secondary | ICD-10-CM | POA: Diagnosis not present

## 2022-04-25 DIAGNOSIS — R600 Localized edema: Secondary | ICD-10-CM | POA: Diagnosis not present

## 2022-06-06 DIAGNOSIS — D485 Neoplasm of uncertain behavior of skin: Secondary | ICD-10-CM | POA: Diagnosis not present

## 2022-06-06 DIAGNOSIS — D2261 Melanocytic nevi of right upper limb, including shoulder: Secondary | ICD-10-CM | POA: Diagnosis not present

## 2022-06-06 DIAGNOSIS — Z85828 Personal history of other malignant neoplasm of skin: Secondary | ICD-10-CM | POA: Diagnosis not present

## 2022-06-06 DIAGNOSIS — C44519 Basal cell carcinoma of skin of other part of trunk: Secondary | ICD-10-CM | POA: Diagnosis not present

## 2022-06-06 DIAGNOSIS — L538 Other specified erythematous conditions: Secondary | ICD-10-CM | POA: Diagnosis not present

## 2022-06-06 DIAGNOSIS — L820 Inflamed seborrheic keratosis: Secondary | ICD-10-CM | POA: Diagnosis not present

## 2022-06-06 DIAGNOSIS — L309 Dermatitis, unspecified: Secondary | ICD-10-CM | POA: Diagnosis not present

## 2022-06-06 DIAGNOSIS — D2271 Melanocytic nevi of right lower limb, including hip: Secondary | ICD-10-CM | POA: Diagnosis not present

## 2022-06-06 DIAGNOSIS — L298 Other pruritus: Secondary | ICD-10-CM | POA: Diagnosis not present

## 2022-06-12 DIAGNOSIS — M25551 Pain in right hip: Secondary | ICD-10-CM | POA: Diagnosis not present

## 2022-07-01 DIAGNOSIS — E079 Disorder of thyroid, unspecified: Secondary | ICD-10-CM | POA: Diagnosis not present

## 2022-07-01 DIAGNOSIS — E782 Mixed hyperlipidemia: Secondary | ICD-10-CM | POA: Diagnosis not present

## 2022-07-01 DIAGNOSIS — M810 Age-related osteoporosis without current pathological fracture: Secondary | ICD-10-CM | POA: Diagnosis not present

## 2022-07-01 DIAGNOSIS — Z1231 Encounter for screening mammogram for malignant neoplasm of breast: Secondary | ICD-10-CM | POA: Diagnosis not present

## 2022-07-01 DIAGNOSIS — T466X5A Adverse effect of antihyperlipidemic and antiarteriosclerotic drugs, initial encounter: Secondary | ICD-10-CM | POA: Diagnosis not present

## 2022-07-01 DIAGNOSIS — Z79899 Other long term (current) drug therapy: Secondary | ICD-10-CM | POA: Diagnosis not present

## 2022-07-01 DIAGNOSIS — Z23 Encounter for immunization: Secondary | ICD-10-CM | POA: Diagnosis not present

## 2022-07-01 DIAGNOSIS — M25551 Pain in right hip: Secondary | ICD-10-CM | POA: Diagnosis not present

## 2022-07-01 DIAGNOSIS — I10 Essential (primary) hypertension: Secondary | ICD-10-CM | POA: Diagnosis not present

## 2022-07-01 DIAGNOSIS — G720 Drug-induced myopathy: Secondary | ICD-10-CM | POA: Diagnosis not present

## 2022-07-03 ENCOUNTER — Other Ambulatory Visit: Payer: Self-pay | Admitting: Internal Medicine

## 2022-07-03 DIAGNOSIS — Z1231 Encounter for screening mammogram for malignant neoplasm of breast: Secondary | ICD-10-CM

## 2022-07-08 ENCOUNTER — Other Ambulatory Visit: Payer: Self-pay | Admitting: Orthopedic Surgery

## 2022-07-08 DIAGNOSIS — M25551 Pain in right hip: Secondary | ICD-10-CM | POA: Diagnosis not present

## 2022-07-11 ENCOUNTER — Ambulatory Visit
Admission: RE | Admit: 2022-07-11 | Discharge: 2022-07-11 | Disposition: A | Discharge status: Home / Self Care | Payer: PPO | Source: Ambulatory Visit | Attending: Orthopedic Surgery | Admitting: Orthopedic Surgery

## 2022-07-11 DIAGNOSIS — M25551 Pain in right hip: Secondary | ICD-10-CM

## 2022-07-16 DIAGNOSIS — C44519 Basal cell carcinoma of skin of other part of trunk: Secondary | ICD-10-CM | POA: Diagnosis not present

## 2022-07-22 DIAGNOSIS — M7601 Gluteal tendinitis, right hip: Secondary | ICD-10-CM | POA: Diagnosis not present

## 2022-07-22 DIAGNOSIS — S76011D Strain of muscle, fascia and tendon of right hip, subsequent encounter: Secondary | ICD-10-CM | POA: Diagnosis not present

## 2022-08-07 DIAGNOSIS — R000 Tachycardia, unspecified: Secondary | ICD-10-CM | POA: Diagnosis not present

## 2022-08-07 DIAGNOSIS — E782 Mixed hyperlipidemia: Secondary | ICD-10-CM | POA: Diagnosis not present

## 2022-08-07 DIAGNOSIS — K219 Gastro-esophageal reflux disease without esophagitis: Secondary | ICD-10-CM | POA: Diagnosis not present

## 2022-08-07 DIAGNOSIS — I10 Essential (primary) hypertension: Secondary | ICD-10-CM | POA: Diagnosis not present

## 2022-08-07 DIAGNOSIS — R0609 Other forms of dyspnea: Secondary | ICD-10-CM | POA: Diagnosis not present

## 2022-08-07 DIAGNOSIS — R002 Palpitations: Secondary | ICD-10-CM | POA: Diagnosis not present

## 2022-09-05 DIAGNOSIS — J028 Acute pharyngitis due to other specified organisms: Secondary | ICD-10-CM | POA: Diagnosis not present

## 2022-09-05 DIAGNOSIS — Z79899 Other long term (current) drug therapy: Secondary | ICD-10-CM | POA: Diagnosis not present

## 2022-09-05 DIAGNOSIS — B379 Candidiasis, unspecified: Secondary | ICD-10-CM | POA: Diagnosis not present

## 2022-09-05 DIAGNOSIS — Z03818 Encounter for observation for suspected exposure to other biological agents ruled out: Secondary | ICD-10-CM | POA: Diagnosis not present

## 2022-09-05 DIAGNOSIS — B9789 Other viral agents as the cause of diseases classified elsewhere: Secondary | ICD-10-CM | POA: Diagnosis not present

## 2022-11-04 ENCOUNTER — Emergency Department: Payer: Medicare HMO

## 2022-11-04 ENCOUNTER — Inpatient Hospital Stay
Admission: EM | Admit: 2022-11-04 | Discharge: 2022-11-08 | DRG: 482 | Disposition: A | Discharge status: Home Health | Payer: Medicare HMO | Attending: Internal Medicine | Admitting: Internal Medicine

## 2022-11-04 ENCOUNTER — Other Ambulatory Visit: Payer: Self-pay

## 2022-11-04 DIAGNOSIS — E871 Hypo-osmolality and hyponatremia: Secondary | ICD-10-CM | POA: Diagnosis present

## 2022-11-04 DIAGNOSIS — Z79890 Hormone replacement therapy (postmenopausal): Secondary | ICD-10-CM | POA: Diagnosis not present

## 2022-11-04 DIAGNOSIS — W010XXA Fall on same level from slipping, tripping and stumbling without subsequent striking against object, initial encounter: Secondary | ICD-10-CM | POA: Diagnosis present

## 2022-11-04 DIAGNOSIS — K5900 Constipation, unspecified: Secondary | ICD-10-CM | POA: Diagnosis present

## 2022-11-04 DIAGNOSIS — S72141A Displaced intertrochanteric fracture of right femur, initial encounter for closed fracture: Secondary | ICD-10-CM

## 2022-11-04 DIAGNOSIS — Z85828 Personal history of other malignant neoplasm of skin: Secondary | ICD-10-CM | POA: Diagnosis not present

## 2022-11-04 DIAGNOSIS — E785 Hyperlipidemia, unspecified: Secondary | ICD-10-CM | POA: Diagnosis present

## 2022-11-04 DIAGNOSIS — Z79818 Long term (current) use of other agents affecting estrogen receptors and estrogen levels: Secondary | ICD-10-CM

## 2022-11-04 DIAGNOSIS — Z881 Allergy status to other antibiotic agents status: Secondary | ICD-10-CM | POA: Diagnosis not present

## 2022-11-04 DIAGNOSIS — I10 Essential (primary) hypertension: Secondary | ICD-10-CM | POA: Diagnosis present

## 2022-11-04 DIAGNOSIS — S7290XA Unspecified fracture of unspecified femur, initial encounter for closed fracture: Secondary | ICD-10-CM | POA: Diagnosis present

## 2022-11-04 DIAGNOSIS — Z833 Family history of diabetes mellitus: Secondary | ICD-10-CM

## 2022-11-04 DIAGNOSIS — Z8249 Family history of ischemic heart disease and other diseases of the circulatory system: Secondary | ICD-10-CM | POA: Diagnosis not present

## 2022-11-04 DIAGNOSIS — S72001A Fracture of unspecified part of neck of right femur, initial encounter for closed fracture: Principal | ICD-10-CM

## 2022-11-04 DIAGNOSIS — E876 Hypokalemia: Secondary | ICD-10-CM | POA: Insufficient documentation

## 2022-11-04 DIAGNOSIS — M810 Age-related osteoporosis without current pathological fracture: Secondary | ICD-10-CM | POA: Diagnosis present

## 2022-11-04 DIAGNOSIS — E079 Disorder of thyroid, unspecified: Secondary | ICD-10-CM | POA: Diagnosis present

## 2022-11-04 DIAGNOSIS — Z882 Allergy status to sulfonamides status: Secondary | ICD-10-CM | POA: Diagnosis not present

## 2022-11-04 DIAGNOSIS — Z79899 Other long term (current) drug therapy: Secondary | ICD-10-CM

## 2022-11-04 DIAGNOSIS — F419 Anxiety disorder, unspecified: Secondary | ICD-10-CM | POA: Diagnosis present

## 2022-11-04 DIAGNOSIS — E039 Hypothyroidism, unspecified: Secondary | ICD-10-CM | POA: Diagnosis present

## 2022-11-04 DIAGNOSIS — Z808 Family history of malignant neoplasm of other organs or systems: Secondary | ICD-10-CM

## 2022-11-04 DIAGNOSIS — Z888 Allergy status to other drugs, medicaments and biological substances status: Secondary | ICD-10-CM | POA: Diagnosis not present

## 2022-11-04 LAB — CBC WITH DIFFERENTIAL/PLATELET
Abs Immature Granulocytes: 0.06 10*3/uL (ref 0.00–0.07)
Basophils Absolute: 0.1 10*3/uL (ref 0.0–0.1)
Basophils Relative: 1 %
Eosinophils Absolute: 0.1 10*3/uL (ref 0.0–0.5)
Eosinophils Relative: 1 %
HCT: 35 % — ABNORMAL LOW (ref 36.0–46.0)
Hemoglobin: 11.9 g/dL — ABNORMAL LOW (ref 12.0–15.0)
Immature Granulocytes: 1 %
Lymphocytes Relative: 32 %
Lymphs Abs: 3.2 10*3/uL (ref 0.7–4.0)
MCH: 30.1 pg (ref 26.0–34.0)
MCHC: 34 g/dL (ref 30.0–36.0)
MCV: 88.6 fL (ref 80.0–100.0)
Monocytes Absolute: 0.9 10*3/uL (ref 0.1–1.0)
Monocytes Relative: 9 %
Neutro Abs: 5.4 10*3/uL (ref 1.7–7.7)
Neutrophils Relative %: 56 %
Platelets: 292 10*3/uL (ref 150–400)
RBC: 3.95 MIL/uL (ref 3.87–5.11)
RDW: 13.3 % (ref 11.5–15.5)
WBC: 9.7 10*3/uL (ref 4.0–10.5)
nRBC: 0 % (ref 0.0–0.2)

## 2022-11-04 LAB — COMPREHENSIVE METABOLIC PANEL
ALT: 29 U/L (ref 0–44)
AST: 29 U/L (ref 15–41)
Albumin: 3.7 g/dL (ref 3.5–5.0)
Alkaline Phosphatase: 62 U/L (ref 38–126)
Anion gap: 10 (ref 5–15)
BUN: 12 mg/dL (ref 8–23)
CO2: 23 mmol/L (ref 22–32)
Calcium: 8.6 mg/dL — ABNORMAL LOW (ref 8.9–10.3)
Chloride: 98 mmol/L (ref 98–111)
Creatinine, Ser: 0.52 mg/dL (ref 0.44–1.00)
GFR, Estimated: >60 mL/min (ref >60)
Glucose, Bld: 102 mg/dL — ABNORMAL HIGH (ref 70–99)
Potassium: 3.1 mmol/L — ABNORMAL LOW (ref 3.5–5.1)
Sodium: 131 mmol/L — ABNORMAL LOW (ref 135–145)
Total Bilirubin: 0.7 mg/dL (ref 0.3–1.2)
Total Protein: 6.7 g/dL (ref 6.5–8.1)

## 2022-11-04 MED ORDER — ROSUVASTATIN CALCIUM 5 MG PO TABS
5.0000 mg | ORAL_TABLET | Freq: Every day | ORAL | Status: DC
  Administered 2022-11-06 – 2022-11-08 (×3): 5 mg via ORAL
  Filled 2022-11-04 (×4): qty 1

## 2022-11-04 MED ORDER — HYDROMORPHONE HCL 1 MG/ML IJ SOLN
0.5000 mg | Freq: Once | INTRAMUSCULAR | Status: AC
  Administered 2022-11-04: 0.5 mg via INTRAVENOUS
  Filled 2022-11-04: qty 0.5

## 2022-11-04 MED ORDER — HYDROCODONE-ACETAMINOPHEN 5-325 MG PO TABS
1.0000 | ORAL_TABLET | Freq: Four times a day (QID) | ORAL | Status: DC | PRN
  Administered 2022-11-04 – 2022-11-08 (×9): 2 via ORAL
  Filled 2022-11-04 (×10): qty 2

## 2022-11-04 MED ORDER — METHOCARBAMOL 500 MG PO TABS: 500.0000 mg | ORAL_TABLET | Freq: Four times a day (QID) | ORAL | Status: DC | PRN

## 2022-11-04 MED ORDER — ACETAMINOPHEN 325 MG PO TABS
650.0000 mg | ORAL_TABLET | Freq: Four times a day (QID) | ORAL | Status: DC | PRN
  Administered 2022-11-07 – 2022-11-08 (×2): 650 mg via ORAL
  Filled 2022-11-04 (×3): qty 2

## 2022-11-04 MED ORDER — HEPARIN SODIUM (PORCINE) 5000 UNIT/ML IJ SOLN
5000.0000 [IU] | Freq: Three times a day (TID) | INTRAMUSCULAR | Status: AC
  Administered 2022-11-04: 5000 [IU] via SUBCUTANEOUS
  Filled 2022-11-04: qty 1

## 2022-11-04 MED ORDER — SODIUM CHLORIDE 0.9 % IV BOLUS
500.0000 mL | Freq: Once | INTRAVENOUS | Status: AC
  Administered 2022-11-04: 500 mL via INTRAVENOUS

## 2022-11-04 MED ORDER — HYDROMORPHONE HCL 1 MG/ML IJ SOLN
1.0000 mg | INTRAMUSCULAR | Status: DC | PRN
  Administered 2022-11-04 – 2022-11-06 (×6): 1 mg via INTRAVENOUS
  Filled 2022-11-04 (×6): qty 1

## 2022-11-04 MED ORDER — ONDANSETRON HCL 4 MG/2ML IJ SOLN
4.0000 mg | Freq: Once | INTRAMUSCULAR | Status: AC
  Administered 2022-11-05: 4 mg via INTRAVENOUS
  Filled 2022-11-04: qty 2

## 2022-11-04 MED ORDER — POLYETHYLENE GLYCOL 3350 17 G PO PACK: 17.0000 g | PACK | Freq: Every day | ORAL | Status: DC | PRN

## 2022-11-04 MED ORDER — LEVOTHYROXINE SODIUM 50 MCG PO TABS
75.0000 ug | ORAL_TABLET | Freq: Every day | ORAL | Status: DC
  Administered 2022-11-06 – 2022-11-08 (×3): 75 ug via ORAL
  Filled 2022-11-04 (×3): qty 1

## 2022-11-04 MED ORDER — LOSARTAN POTASSIUM 50 MG PO TABS
100.0000 mg | ORAL_TABLET | Freq: Every day | ORAL | Status: DC
  Administered 2022-11-06 – 2022-11-08 (×3): 100 mg via ORAL
  Filled 2022-11-04 (×3): qty 2

## 2022-11-04 MED ORDER — POTASSIUM CHLORIDE CRYS ER 20 MEQ PO TBCR
40.0000 meq | EXTENDED_RELEASE_TABLET | Freq: Once | ORAL | Status: AC
Start: 2022-11-04 — End: 2022-11-04
  Administered 2022-11-04: 40 meq via ORAL
  Filled 2022-11-04: qty 2

## 2022-11-04 MED ORDER — METHOCARBAMOL 1000 MG/10ML IJ SOLN
500.0000 mg | Freq: Four times a day (QID) | INTRAVENOUS | Status: DC | PRN
  Administered 2022-11-04: 500 mg via INTRAVENOUS
  Filled 2022-11-04: qty 5

## 2022-11-04 NOTE — Assessment & Plan Note (Signed)
-  Continue home Synthroid °

## 2022-11-04 NOTE — ED Notes (Signed)
Pt at CT, pt wanted to wait for pain medication after imaging.

## 2022-11-04 NOTE — ED Notes (Addendum)
Patient oxygen sat decreased to 87% on RA after medication administration of pain med. Patient placed on 2L [place]. Oxygen sat increased to 98% on 2L

## 2022-11-04 NOTE — H&P (Addendum)
History and Physical    Patient: Meagan Cain MIW:[id number] DOB: June 25, 1946 DOA: 11/04/2022 DOS: the patient was seen and examined on 11/04/2022 PCP: Idelle Crouch, MD  Patient coming from: Home  Chief Complaint:  Chief Complaint  Patient presents with   Fall   Hip Pain   HPI: Meagan Cain is a 77 y.o. female with medical history significant of hypertension, hyperlipidemia, hypothyroidism, who presents to the ED due to ground-level fall.  Meagan Cain states that she was in her usual state of health today when she was trying to grab something out of a bottom cabinet, her shoe became stuck on the bottom of the cabinet and she ended up tripping over her own she will landing onto her right hip.  While falling, she did hit her head against the fridge, but did not lose consciousness.  Due to right hip pain, she was unable to stand back up by herself.  She denies any dizziness, chest pain, shortness of breath, palpitations, including before or after the fall.  ED course: On arrival to the ED, patient was normotensive at 139/86 with heart rate of 71.  She was saturating at 100% on room air.  She is afebrile at 98.1. Initial workup remarkable for sodium of 131, potassium of 3.1, WBC of 9.6 and hemoglobin of 11.9.  CT of the head and C-spine with no acute intracranial findings or fracture.  Hip x-ray with mildly displaced and angulated intertrochanteric right hip fracture.  Orthopedic surgery consulted.  TRH contacted for admission.  Review of Systems: As mentioned in the history of present illness. All other systems reviewed and are negative.  Past Medical History:  Diagnosis Date   Arthritis    Cancer ([place])    skin cancer   Chronic vaginitis    Cystocele    Depression    Effusion, pericardium    History of shingles    History of skin cancer    Hyperlipemia    Hypertension    Hypothyroidism    Osteoporosis    Rectocele    Thoracic compression fracture ([place])    Urinary incontinence     Vaginal atrophy    Vitamin D deficiency    Past Surgical History:  Procedure Laterality Date   APPENDECTOMY     CHOLECYSTECTOMY  2003   COLONOSCOPY N/A 03/03/2018   Procedure: COLONOSCOPY;  Surgeon: Manya Silvas, MD;  Location: [facility] ENDOSCOPY;  Service: Endoscopy;  Laterality: N/A;   CYSTOCELE REPAIR  07/30/2015   Procedure: ANTERIOR REPAIR (CYSTOCELE);  Surgeon: Brayton Mars, MD;  Location: [facility] ORS;  Service: Gynecology;;   EYE SURGERY Bilateral    Cataract Extraction with IOL implants   TONSILLECTOMY     VAGINAL HYSTERECTOMY     Social History:  reports that she has never smoked. She has never used smokeless tobacco. She reports that she does not drink alcohol and does not use drugs.  Allergies  Allergen Reactions   Ceftin [Cefuroxime Axetil]    Cephalosporins Nausea And Vomiting   Ibandronic Acid Nausea And Vomiting   Sulfa Antibiotics Rash    Family History  Problem Relation Age of Onset   Heart disease Mother    Diabetes Mother    Heart disease Father    Throat cancer Maternal Grandmother    Heart disease Brother    Diabetes Brother    Cancer Neg Hx    Breast cancer Neg Hx    Ovarian cancer Neg Hx    Colon cancer Neg  Hx     Prior to Admission medications   Medication Sig Start Date End Date Taking? Authorizing Provider  aspirin EC 81 MG tablet Take 81 mg by mouth daily.     [provider]  Cholecalciferol (VITAMIN D3) 2000 UNITS capsule Take 1,000 Units by mouth daily.     [provider]  conjugated estrogens (PREMARIN) vaginal cream Place 1 Applicatorful vaginally 2 (two) times a week. 1 gram two times a week 02/18/16   Defrancesco, Alanda Slim, MD  Cyanocobalamin (RA VITAMIN B-12 TR) 1000 MCG TBCR Take 1,000 mcg by mouth daily.     [provider]  levothyroxine (SYNTHROID, LEVOTHROID) 100 MCG tablet Take 100 mcg by mouth daily before breakfast.  02/08/15 01/13/18  [provider]  losartan (COZAAR) 100 MG tablet  Take 100 mg by mouth daily.    [provider]  potassium chloride (K-DUR) 10 MEQ tablet Take 10 mEq by mouth daily.  02/08/15   [provider]    Physical Exam: Vitals:   11/04/22 1900 11/04/22 2220 11/04/22 2230 11/04/22 2300  BP: (!) 145/70 (!) 145/69 130/67 118/63  Pulse: 62 92 87 88  Resp: (!) 24 18    Temp: 98.1 F (36.7 C)     TempSrc: Oral     SpO2: 100% 98% 98% 98%  Weight:      Height:       Physical Exam Vitals and nursing note reviewed.  Constitutional:      General: She is not in acute distress.    Appearance: She is normal weight. She is not toxic-appearing.  HENT:     Head: Normocephalic. No Battle's sign, abrasion or laceration.     Mouth/Throat:     Mouth: Mucous membranes are moist.     Pharynx: Oropharynx is clear.  Eyes:     Conjunctiva/sclera: Conjunctivae normal.     Pupils: Pupils are equal, round, and reactive to light.  Cardiovascular:     Rate and Rhythm: Normal rate and regular rhythm.     Pulses:          Dorsalis pedis pulses are 2+ on the right side and 2+ on the left side.     Heart sounds: No murmur heard. Pulmonary:     Effort: Pulmonary effort is normal. No respiratory distress.     Breath sounds: Normal breath sounds. No wheezing, rhonchi or rales.  Abdominal:     General: Bowel sounds are normal.     Palpations: Abdomen is soft.  Musculoskeletal:        General: Signs of injury (Unable to move right leg due to pain) present.     Right lower leg: No edema.     Left lower leg: No edema.  Skin:    General: Skin is warm and dry.  Neurological:     General: No focal deficit present.     Mental Status: She is alert and oriented to person, place, and time. Mental status is at baseline.  Psychiatric:        Mood and Affect: Mood normal.        Behavior: Behavior normal.    Data Reviewed: CBC with WBC of 9.7, hemoglobin of 11.9, platelets of 292 CMP with sodium of 131, potassium 3.1, bicarb 23, glucose 102, BUN 12,  creatinine 0.52, anion gap of 10, AST 29, ALT 29 and GFR over 60  EKG personally reviewed.  Sinus rhythm with a rate of 72.  No ST or T wave  changes concerning for acute ischemia.  DG Chest 1 View  Result Date: 11/04/2022 CLINICAL DATA:  Pain after fall EXAM: CHEST  1 VIEW COMPARISON:  None Available. FINDINGS: No consolidation, pneumothorax or effusion. Enlarged cardiopericardial silhouette with tortuous aorta. No edema. Surgical clips in the right upper quadrant of the abdomen. Curvature and degenerative changes along the spine. IMPRESSION: No acute cardiopulmonary disease.  Enlarged heart Electronically Signed   By: Jill Side M.D.   On: 11/04/2022 18:42   DG Hip Unilat W or Wo Pelvis 2-3 Views Right  Result Date: 11/04/2022 CLINICAL DATA:  Pain after trauma EXAM: DG HIP (WITH OR WITHOUT PELVIS) 3V RIGHT COMPARISON:  None Available. FINDINGS: Mildly displaced intertrochanteric right hip fracture identified with some angulation. Osteopenia. No additional fracture or dislocation. Mild joint space loss of the right hip. Note is made of significant colonic stool in the midabdomen. There are some well rounded densities in the pelvis which are indeterminate although possibly vascular. IMPRESSION: Mildly displaced and angulated intertrochanteric right hip fracture. Osteopenia. Degenerative change Electronically Signed   By: Jill Side M.D.   On: 11/04/2022 18:41   CT HEAD WO CONTRAST (5MM)  Result Date: 11/04/2022 CLINICAL DATA:  Fall EXAM: CT HEAD WITHOUT CONTRAST CT CERVICAL SPINE WITHOUT CONTRAST TECHNIQUE: Multidetector CT imaging of the head and cervical spine was performed following the standard protocol without intravenous contrast. Multiplanar CT image reconstructions of the cervical spine were also generated. RADIATION DOSE REDUCTION: This exam was performed according to the departmental dose-optimization program which includes automated exposure control, adjustment of the mA and/or kV  according to patient size and/or use of iterative reconstruction technique. COMPARISON:  None Available. FINDINGS: CT HEAD FINDINGS Brain: No evidence of acute infarct, hemorrhage, mass, mass effect, or midline shift. No hydrocephalus or extra-axial fluid collection. Periventricular white matter changes, likely the sequela of chronic small vessel ischemic disease. Remote lacunar infarcts in the bilateral basal ganglia. Vascular: No hyperdense vessel. Skull: Normal. Negative for fracture or focal lesion. Sinuses/Orbits: No acute finding. Other: The mastoid air cells are well aerated. CT CERVICAL SPINE FINDINGS Alignment: Trace retrolisthesis of C3 on C4 and trace anterolisthesis of C5 on C6, which appears facet mediated. Skull base and vertebrae: No acute fracture. No primary bone lesion or focal pathologic process. Partial osseous fusion across the left facets at C4 and C5. Soft tissues and spinal canal: No prevertebral fluid or swelling. No visible canal hematoma. Disc levels: Multilevel disc height loss. No high-grade spinal canal stenosis. Facet and uncovertebral hypertrophy. Upper chest: Negative. Other: None. IMPRESSION: 1. No acute intracranial process. 2. No acute fracture or traumatic listhesis in the cervical spine. Electronically Signed   By: Merilyn Baba M.D.   On: 11/04/2022 18:34   CT Cervical Spine Wo Contrast  Result Date: 11/04/2022 CLINICAL DATA:  Fall EXAM: CT HEAD WITHOUT CONTRAST CT CERVICAL SPINE WITHOUT CONTRAST TECHNIQUE: Multidetector CT imaging of the head and cervical spine was performed following the standard protocol without intravenous contrast. Multiplanar CT image reconstructions of the cervical spine were also generated. RADIATION DOSE REDUCTION: This exam was performed according to the departmental dose-optimization program which includes automated exposure control, adjustment of the mA and/or kV according to patient size and/or use of iterative reconstruction technique.  COMPARISON:  None Available. FINDINGS: CT HEAD FINDINGS Brain: No evidence of acute infarct, hemorrhage, mass, mass effect, or midline shift. No hydrocephalus or extra-axial fluid collection. Periventricular white matter changes, likely the sequela of chronic small vessel ischemic disease. Remote lacunar  infarcts in the bilateral basal ganglia. Vascular: No hyperdense vessel. Skull: Normal. Negative for fracture or focal lesion. Sinuses/Orbits: No acute finding. Other: The mastoid air cells are well aerated. CT CERVICAL SPINE FINDINGS Alignment: Trace retrolisthesis of C3 on C4 and trace anterolisthesis of C5 on C6, which appears facet mediated. Skull base and vertebrae: No acute fracture. No primary bone lesion or focal pathologic process. Partial osseous fusion across the left facets at C4 and C5. Soft tissues and spinal canal: No prevertebral fluid or swelling. No visible canal hematoma. Disc levels: Multilevel disc height loss. No high-grade spinal canal stenosis. Facet and uncovertebral hypertrophy. Upper chest: Negative. Other: None. IMPRESSION: 1. No acute intracranial process. 2. No acute fracture or traumatic listhesis in the cervical spine. Electronically Signed   By: Merilyn Baba M.D.   On: 11/04/2022 18:34    There are no new results to review at this time.  Assessment and Plan:  * Femur fracture [facility]) Patient presenting with a mildly displaced and angulated right intertrochanteric femur fracture in the setting of a mechanical fall.  Score of 0 on the revised cardiac risk index.  - Orthopedic surgery consulted; appreciate their recommendations - N.p.o. 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  - One-time dose of 40 mEq of Kdur - Resume home medications tomorrow  Hypothyroidism - Continue home  Synthroid  Hypertension - Continue home antihypertensives  Advance Care Planning:   Code Status: Full Code verified by patient  Consults: Orthopedic surgery  Family Communication: Patient's husband updated at bedside  Severity of Illness: The appropriate patient status for this patient is INPATIENT. Inpatient status is judged to be reasonable and necessary in order to provide the required intensity of service to ensure the patient's safety. The patient's presenting symptoms, physical exam findings, and initial radiographic and laboratory data in the context of their chronic comorbidities is felt to place them at high risk for further clinical deterioration. Furthermore, it is not anticipated that the patient will be medically stable for discharge from the hospital within 2 midnights of admission.   * I certify that at the point of admission it is my clinical judgment that the patient will require inpatient hospital care spanning beyond 2 midnights from the point of admission due to high intensity of service, high risk for further deterioration and high frequency of surveillance required.*  Author: Jose Persia, MD 11/04/2022 11:30 PM  For on call review [email or web address].

## 2022-11-04 NOTE — ED Provider Notes (Addendum)
[facility] Provider Note    Event Date/Time   First MD Initiated Contact with Patient 11/04/22 1755     (approximate)   History   Fall and Hip Pain   HPI  Meagan Cain is a 77 y.o. female past medical history of hypertension hyperlipidemia who presents because of a fall.  Patient was walking into her house today when she tripped over some shoes.  She did hit the right side of her head on the refrigerator as she went down.  She was unable to get up because of right hip pain.  She was seen at [facility] several months ago for hip pain and was diagnosed with a labral tear but she had been doing okay and was able to ambulate.  Has not been able to bear weight on that right hip since the fall.  Denies other injuries.  Was feeling fine before she is not on blood thinners.     Past Medical History:  Diagnosis Date   Arthritis    Cancer ([place])    skin cancer   Chronic vaginitis    Cystocele    Depression    Effusion, pericardium    History of shingles    History of skin cancer    Hyperlipemia    Hypertension    Hypothyroidism    Osteoporosis    Rectocele    Thoracic compression fracture ([place])    Urinary incontinence    Vaginal atrophy    Vitamin D deficiency     Patient Active Problem List   Diagnosis Date Noted   Vaginal atrophy 01/13/2018   Clinical depression 05/31/2015   BP (high blood pressure) 05/31/2015   Disease of thyroid gland 05/31/2015   Midline cystocele 05/31/2015   Rectocele 05/31/2015   Thoracic compression fracture ([place]) 05/31/2015   Urinary incontinence 05/31/2015   Vitamin D deficiency 05/31/2015   Hypothyroidism 05/31/2015   History of shingles 05/31/2015   History of skin cancer 05/31/2015   Chronic otitis media 05/31/2015   Status post vaginal hysterectomy 05/31/2015   Status post laparoscopic cholecystectomy 05/31/2015   Status post appendectomy 05/31/2015   Acid reflux 03/13/2014   HLD (hyperlipidemia) 03/13/2014    Below normal amount of sodium in the blood 03/13/2014   OP (osteoporosis) 03/13/2014     Physical Exam  Triage Vital Signs: ED Triage Vitals [11/04/22 1754]  Enc Vitals Group     BP (!) 151/83     Pulse Rate 68     Resp 17     Temp 98.1 F (36.7 C)     Temp Source Oral     SpO2 97 %     Weight      Height      Head Circumference      Peak Flow      Pain Score      Pain Loc      Pain Edu?      Excl. in [place]?     Most recent vital signs: Vitals:   11/04/22 1800 11/04/22 1845  BP: 139/86 (!) 145/76  Pulse: 71 64  Resp:  (!) 22  Temp:    SpO2: 100% 100%     General: Awake, no distress.  CV:  Good peripheral perfusion.  Resp:  Normal effort.  Abd:  No distention.  Neuro:             Awake, Alert, Oriented x 3  Mild tenderness palpation over the right pelvis Right lower extremity  may be very slightly shortened but it is held in normal position able to internally and externally rotate without significant pain, significant pain with flexion however 2+ DP pulse bilateral lower extremities  Swelling over the right parietal scalp without laceration No tenderness of the bilateral upper extremities no chest wall tenderness abdomen soft nontender   ED Results / Procedures / Treatments  Labs (all labs ordered are listed, but only abnormal results are displayed) Labs Reviewed  COMPREHENSIVE METABOLIC PANEL - Abnormal; Notable for the following components:      Result Value   Sodium 131 (*)    Potassium 3.1 (*)    Glucose, Bld 102 (*)    Calcium 8.6 (*)    All other components within normal limits  CBC WITH DIFFERENTIAL/PLATELET - Abnormal; Notable for the following components:   Hemoglobin 11.9 (*)    HCT 35.0 (*)    All other components within normal limits     EKG  EKG interpretation performed by myself: NSR, nml axis, nml intervals, no acute ischemic changes, low voltage   RADIOLOGY I reviewed and interpreted the CT scan of the brain which does not show  any acute intracranial process    PROCEDURES:  Critical Care performed: No  Procedures  The patient is on the cardiac monitor to evaluate for evidence of arrhythmia and/or significant heart rate changes.   MEDICATIONS ORDERED IN ED: Medications  HYDROmorphone (DILAUDID) injection 0.5 mg (0.5 mg Intravenous Given 11/04/22 1841)     IMPRESSION / MDM / ASSESSMENT AND PLAN / ED COURSE  I reviewed the triage vital signs and the nursing notes.                              Patient's presentation is most consistent with acute complicated illness / injury requiring diagnostic workup.  Differential diagnosis includes, but is not limited to, hip fracture, pelvic fracture, ligamentous injury, intracranial hemorrhage, skull fracture, cervical spine fracture  Patient is a 77 year old female presents with right hip pain after mechanical fall today.  She typically walks unassisted slipped over some shoes today did hit her head and fell onto the right hip.  Was not able to get up due to pain in the right hip.  She has minimal other complaints other than some mild headache and hip pain.  Otherwise was doing well prior to the injury.  On exam she does have some swelling in the right parietal region no laceration.  She has significant amount of pain with minimal range of the hip and the leg is slightly shortened she has good pulses and is neurologically intact.  Plan to obtain x-ray CT head C-spine labs as I suspect that she likely has a fracture.  Will give IV opiates for pain control.  X-ray shows right intertrochanteric hip fracture.  CT head and C-spine are negative for acute injury.  Labs overall reassuring.  Discussed with Dr. Harlow Mares with orthopedics.  Patient will require admission.      FINAL CLINICAL IMPRESSION(S) / ED DIAGNOSES   Final diagnoses:  Closed fracture of right hip, initial encounter [facility])     Rx / DC Orders   ED Discharge Orders     None        Note:  This  document was prepared using Dragon voice recognition software and may include unintentional dictation errors.   Rada Hay, MD 11/04/22 1847    Rada Hay, MD 11/04/22 [phone]

## 2022-11-04 NOTE — ED Triage Notes (Addendum)
Pt presents to ED with c/o of R hip pain. EMS states fell at 1630 and was found on the floor since then. Pt denies LOC. Pt state she tripped over shoes. Pt is A&Ox4. Pt denies dizziness. Pt has small bump to R side of head, no lac noted.    Fall was of mechanical nature, pt states she tripped over shoes and did hit her head on the way down to the floor. Pt denies blood thinner use.   EMS gave 200 mcg of fentanyl PTA.  '4mg'$  of zofran given.  18 G R wrist.

## 2022-11-04 NOTE — Assessment & Plan Note (Signed)
-  Continue home antihypertensives 

## 2022-11-04 NOTE — Assessment & Plan Note (Addendum)
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  - One-time dose of 40 mEq of Kdur - Resume home medications tomorrow

## 2022-11-04 NOTE — ED Notes (Signed)
Patient given cup of water

## 2022-11-04 NOTE — Assessment & Plan Note (Addendum)
Patient presenting with a mildly displaced and angulated right intertrochanteric femur fracture in the setting of a mechanical fall.  Score of 0 on the revised cardiac risk index.  - Orthopedic surgery consulted; appreciate their recommendations - N.p.o. after midnight - Bedrest until surgery - Norco, Robaxin and Dilaudid for pain control - Zofran as needed for nausea

## 2022-11-05 ENCOUNTER — Encounter
Admission: EM | Disposition: A | Discharge status: Home Health | Payer: Self-pay | Source: Home / Self Care | Attending: Internal Medicine

## 2022-11-05 ENCOUNTER — Other Ambulatory Visit: Payer: Self-pay

## 2022-11-05 ENCOUNTER — Encounter: Payer: Self-pay | Admitting: Internal Medicine

## 2022-11-05 ENCOUNTER — Inpatient Hospital Stay: Payer: Medicare HMO | Admitting: General Practice

## 2022-11-05 ENCOUNTER — Inpatient Hospital Stay: Payer: Medicare HMO

## 2022-11-05 DIAGNOSIS — S72141A Displaced intertrochanteric fracture of right femur, initial encounter for closed fracture: Secondary | ICD-10-CM

## 2022-11-05 HISTORY — PX: INTRAMEDULLARY (IM) NAIL INTERTROCHANTERIC: SHX5875

## 2022-11-05 LAB — CBC
HCT: 32.4 % — ABNORMAL LOW (ref 36.0–46.0)
Hemoglobin: 11 g/dL — ABNORMAL LOW (ref 12.0–15.0)
MCH: 29.9 pg (ref 26.0–34.0)
MCHC: 34 g/dL (ref 30.0–36.0)
MCV: 88 fL (ref 80.0–100.0)
Platelets: 256 10*3/uL (ref 150–400)
RBC: 3.68 MIL/uL — ABNORMAL LOW (ref 3.87–5.11)
RDW: 13.4 % (ref 11.5–15.5)
WBC: 9.5 10*3/uL (ref 4.0–10.5)
nRBC: 0 % (ref 0.0–0.2)

## 2022-11-05 LAB — BASIC METABOLIC PANEL
Anion gap: 5 (ref 5–15)
BUN: 11 mg/dL (ref 8–23)
CO2: 26 mmol/L (ref 22–32)
Calcium: 8.3 mg/dL — ABNORMAL LOW (ref 8.9–10.3)
Chloride: 100 mmol/L (ref 98–111)
Creatinine, Ser: 0.6 mg/dL (ref 0.44–1.00)
GFR, Estimated: >60 mL/min (ref >60)
Glucose, Bld: 142 mg/dL — ABNORMAL HIGH (ref 70–99)
Potassium: 3.9 mmol/L (ref 3.5–5.1)
Sodium: 131 mmol/L — ABNORMAL LOW (ref 135–145)

## 2022-11-05 LAB — GLUCOSE, CAPILLARY: Glucose-Capillary: 88 mg/dL (ref 70–99)

## 2022-11-05 LAB — SURGICAL PCR SCREEN
MRSA, PCR: NEGATIVE
Staphylococcus aureus: NEGATIVE

## 2022-11-05 LAB — TROPONIN I (HIGH SENSITIVITY)
Troponin I (High Sensitivity): 2 ng/L (ref <18)
Troponin I (High Sensitivity): <2 ng/L (ref <18)

## 2022-11-05 SURGERY — FIXATION, FRACTURE, INTERTROCHANTERIC, WITH INTRAMEDULLARY ROD
Anesthesia: General | Laterality: Right

## 2022-11-05 MED ORDER — LIDOCAINE HCL (CARDIAC) PF 100 MG/5ML IV SOSY
PREFILLED_SYRINGE | INTRAVENOUS | Status: DC | PRN
  Administered 2022-11-05: 60 mg via INTRAVENOUS

## 2022-11-05 MED ORDER — SUGAMMADEX SODIUM 200 MG/2ML IV SOLN
INTRAVENOUS | Status: DC | PRN
  Administered 2022-11-05: 200 mg via INTRAVENOUS

## 2022-11-05 MED ORDER — DIPHENHYDRAMINE HCL 12.5 MG/5ML PO ELIX: 12.5000 mg | ORAL_SOLUTION | ORAL | Status: DC | PRN

## 2022-11-05 MED ORDER — KETOROLAC TROMETHAMINE 30 MG/ML IJ SOLN
INTRAMUSCULAR | Status: AC
  Filled 2022-11-05: qty 1

## 2022-11-05 MED ORDER — LACTATED RINGERS IV SOLN: INTRAVENOUS | Status: DC

## 2022-11-05 MED ORDER — FENTANYL CITRATE (PF) 100 MCG/2ML IJ SOLN
25.0000 ug | INTRAMUSCULAR | Status: DC | PRN
  Administered 2022-11-05 (×2): 25 ug via INTRAVENOUS

## 2022-11-05 MED ORDER — CEFAZOLIN SODIUM-DEXTROSE 2-4 GM/100ML-% IV SOLN
2.0000 g | Freq: Three times a day (TID) | INTRAVENOUS | Status: AC
  Administered 2022-11-06 (×2): 2 g via INTRAVENOUS
  Filled 2022-11-05 (×2): qty 100

## 2022-11-05 MED ORDER — FENTANYL CITRATE (PF) 100 MCG/2ML IJ SOLN
INTRAMUSCULAR | Status: AC
  Administered 2022-11-05: 50 ug via INTRAVENOUS
  Filled 2022-11-05: qty 2

## 2022-11-05 MED ORDER — ROCURONIUM BROMIDE 100 MG/10ML IV SOLN
INTRAVENOUS | Status: DC | PRN
  Administered 2022-11-05: 30 mg via INTRAVENOUS

## 2022-11-05 MED ORDER — TRANEXAMIC ACID-NACL 1000-0.7 MG/100ML-% IV SOLN
1000.0000 mg | INTRAVENOUS | Status: AC
  Filled 2022-11-05: qty 100

## 2022-11-05 MED ORDER — PHENYLEPHRINE HCL-NACL 20-0.9 MG/250ML-% IV SOLN
INTRAVENOUS | Status: DC | PRN
  Administered 2022-11-05: 30 ug/min via INTRAVENOUS

## 2022-11-05 MED ORDER — OXYCODONE HCL 5 MG PO TABS
5.0000 mg | ORAL_TABLET | Freq: Once | ORAL | Status: AC | PRN
  Administered 2022-11-05: 5 mg via ORAL

## 2022-11-05 MED ORDER — ONDANSETRON HCL 4 MG/2ML IJ SOLN
INTRAMUSCULAR | Status: DC | PRN
  Administered 2022-11-05: 4 mg via INTRAVENOUS

## 2022-11-05 MED ORDER — TRANEXAMIC ACID 1000 MG/10ML IV SOLN
INTRAVENOUS | Status: DC | PRN
  Administered 2022-11-05: 1000 mg via INTRAVENOUS

## 2022-11-05 MED ORDER — TRANEXAMIC ACID 1000 MG/10ML IV SOLN
INTRAVENOUS | Status: AC
  Filled 2022-11-05: qty 10

## 2022-11-05 MED ORDER — LACTATED RINGERS IV SOLN: INTRAVENOUS | Status: DC | PRN

## 2022-11-05 MED ORDER — ONDANSETRON HCL 4 MG/2ML IJ SOLN: 4.0000 mg | Freq: Four times a day (QID) | INTRAMUSCULAR | Status: DC | PRN

## 2022-11-05 MED ORDER — KETOROLAC TROMETHAMINE 30 MG/ML IJ SOLN
INTRAMUSCULAR | Status: DC | PRN
  Administered 2022-11-05: 30 mg via INTRAVENOUS

## 2022-11-05 MED ORDER — EPINEPHRINE PF 1 MG/ML IJ SOLN
INTRAMUSCULAR | Status: AC
  Filled 2022-11-05: qty 1

## 2022-11-05 MED ORDER — CEFAZOLIN SODIUM-DEXTROSE 2-3 GM-%(50ML) IV SOLR
INTRAVENOUS | Status: DC | PRN
  Administered 2022-11-05: 2 g via INTRAVENOUS

## 2022-11-05 MED ORDER — PROPOFOL 10 MG/ML IV BOLUS
INTRAVENOUS | Status: DC | PRN
  Administered 2022-11-05: 100 mg via INTRAVENOUS

## 2022-11-05 MED ORDER — ACETAMINOPHEN 10 MG/ML IV SOLN: 15.0000 mg/kg | Freq: Once | INTRAVENOUS | Status: DC | PRN

## 2022-11-05 MED ORDER — METHOCARBAMOL 500 MG PO TABS
500.0000 mg | ORAL_TABLET | Freq: Four times a day (QID) | ORAL | Status: DC | PRN
  Administered 2022-11-06 – 2022-11-08 (×3): 500 mg via ORAL
  Filled 2022-11-05 (×3): qty 1

## 2022-11-05 MED ORDER — SUCCINYLCHOLINE CHLORIDE 200 MG/10ML IV SOSY
PREFILLED_SYRINGE | INTRAVENOUS | Status: DC | PRN
  Administered 2022-11-05: 80 mg via INTRAVENOUS

## 2022-11-05 MED ORDER — METOCLOPRAMIDE HCL 5 MG/ML IJ SOLN: 5.0000 mg | Freq: Three times a day (TID) | INTRAMUSCULAR | Status: DC | PRN

## 2022-11-05 MED ORDER — POLYETHYLENE GLYCOL 3350 17 G PO PACK
17.0000 g | PACK | Freq: Every day | ORAL | Status: DC | PRN
  Administered 2022-11-07: 17 g via ORAL
  Filled 2022-11-05: qty 1

## 2022-11-05 MED ORDER — ACETAMINOPHEN 10 MG/ML IV SOLN
INTRAVENOUS | Status: AC
  Filled 2022-11-05: qty 100

## 2022-11-05 MED ORDER — LACTATED RINGERS IV SOLN: INTRAVENOUS | Status: AC

## 2022-11-05 MED ORDER — ACETAMINOPHEN 10 MG/ML IV SOLN
INTRAVENOUS | Status: DC | PRN
  Administered 2022-11-05: 1000 mg via INTRAVENOUS

## 2022-11-05 MED ORDER — MUPIROCIN 2 % EX OINT
1.0000 | TOPICAL_OINTMENT | Freq: Two times a day (BID) | CUTANEOUS | Status: DC
  Administered 2022-11-05 (×2): 1 via NASAL
  Filled 2022-11-05: qty 22

## 2022-11-05 MED ORDER — BUPIVACAINE HCL (PF) 0.5 % IJ SOLN
INTRAMUSCULAR | Status: AC
  Filled 2022-11-05: qty 10

## 2022-11-05 MED ORDER — DOCUSATE SODIUM 100 MG PO CAPS
100.0000 mg | ORAL_CAPSULE | Freq: Two times a day (BID) | ORAL | Status: DC
  Administered 2022-11-06 – 2022-11-08 (×6): 100 mg via ORAL
  Filled 2022-11-05 (×6): qty 1

## 2022-11-05 MED ORDER — OXYCODONE HCL 5 MG/5ML PO SOLN: 5.0000 mg | Freq: Once | ORAL | Status: AC | PRN

## 2022-11-05 MED ORDER — PHENYLEPHRINE HCL-NACL 20-0.9 MG/250ML-% IV SOLN
INTRAVENOUS | Status: AC
  Filled 2022-11-05: qty 250

## 2022-11-05 MED ORDER — METHOCARBAMOL 1000 MG/10ML IJ SOLN: 500.0000 mg | Freq: Four times a day (QID) | INTRAVENOUS | Status: DC | PRN

## 2022-11-05 MED ORDER — METOCLOPRAMIDE HCL 5 MG PO TABS: 5.0000 mg | ORAL_TABLET | Freq: Three times a day (TID) | ORAL | Status: DC | PRN

## 2022-11-05 MED ORDER — NITROGLYCERIN 0.4 MG SL SUBL
0.4000 mg | SUBLINGUAL_TABLET | SUBLINGUAL | Status: DC | PRN
  Administered 2022-11-05: 0.4 mg via SUBLINGUAL
  Filled 2022-11-05: qty 1

## 2022-11-05 MED ORDER — PROPOFOL 1000 MG/100ML IV EMUL
INTRAVENOUS | Status: AC
  Filled 2022-11-05: qty 100

## 2022-11-05 MED ORDER — FENTANYL CITRATE (PF) 100 MCG/2ML IJ SOLN
INTRAMUSCULAR | Status: AC
  Filled 2022-11-05: qty 2

## 2022-11-05 MED ORDER — OXYCODONE HCL 5 MG PO TABS
ORAL_TABLET | ORAL | Status: AC
  Filled 2022-11-05: qty 1

## 2022-11-05 MED ORDER — FENTANYL CITRATE (PF) 100 MCG/2ML IJ SOLN
INTRAMUSCULAR | Status: DC | PRN
  Administered 2022-11-05 (×2): 50 ug via INTRAVENOUS

## 2022-11-05 MED ORDER — CLINDAMYCIN PHOSPHATE 600 MG/50ML IV SOLN: 600.0000 mg | INTRAVENOUS | Status: DC

## 2022-11-05 MED ORDER — DEXAMETHASONE SODIUM PHOSPHATE 10 MG/ML IJ SOLN
INTRAMUSCULAR | Status: DC | PRN
  Administered 2022-11-05: 10 mg via INTRAVENOUS

## 2022-11-05 MED ORDER — BUPIVACAINE HCL (PF) 0.25 % IJ SOLN
INTRAMUSCULAR | Status: AC
  Filled 2022-11-05: qty 30

## 2022-11-05 MED ORDER — BISACODYL 10 MG RE SUPP
10.0000 mg | Freq: Every day | RECTAL | Status: DC | PRN
  Administered 2022-11-08: 10 mg via RECTAL
  Filled 2022-11-05: qty 1

## 2022-11-05 MED ORDER — MAGNESIUM CITRATE PO SOLN: 1.0000 | Freq: Once | ORAL | Status: DC | PRN

## 2022-11-05 MED ORDER — ZOLPIDEM TARTRATE 5 MG PO TABS: 5.0000 mg | ORAL_TABLET | Freq: Every evening | ORAL | Status: DC | PRN

## 2022-11-05 MED ORDER — BUPIVACAINE-EPINEPHRINE (PF) 0.25% -1:200000 IJ SOLN
INTRAMUSCULAR | Status: DC | PRN
  Administered 2022-11-05: 30 mL

## 2022-11-05 MED ORDER — 0.9 % SODIUM CHLORIDE (POUR BTL) OPTIME
TOPICAL | Status: DC | PRN
  Administered 2022-11-05: 500 mL

## 2022-11-05 MED ORDER — ONDANSETRON HCL 4 MG/2ML IJ SOLN: 4.0000 mg | Freq: Once | INTRAMUSCULAR | Status: DC | PRN

## 2022-11-05 MED ORDER — ONDANSETRON HCL 4 MG PO TABS: 4.0000 mg | ORAL_TABLET | Freq: Four times a day (QID) | ORAL | Status: DC | PRN

## 2022-11-05 SURGICAL SUPPLY — 39 items
BIT DRILL CANN 16 HIP (BIT) IMPLANT
BIT DRILL CANN STP 6/9 HIP (BIT) IMPLANT
BLADE 85 HELICAL TFNA (Anchor) IMPLANT
BNDG COHESIVE 4X5 TAN STRL LF (GAUZE/BANDAGES/DRESSINGS) IMPLANT
BRUSH SCRUB EZ  4% CHG (MISCELLANEOUS) ×2
BRUSH SCRUB EZ 4% CHG (MISCELLANEOUS) ×2 IMPLANT
CHLORAPREP W/TINT 26 (MISCELLANEOUS) ×1 IMPLANT
DRAPE 3/4 80X56 (DRAPES) ×1 IMPLANT
DRAPE U-SHAPE 47X51 STRL (DRAPES) ×1 IMPLANT
DRSG AQUACEL AG ADV 3.5X 4 (GAUZE/BANDAGES/DRESSINGS) IMPLANT
DRSG AQUACEL AG ADV 3.5X10 (GAUZE/BANDAGES/DRESSINGS) ×1 IMPLANT
ELECT REM PT RETURN 9FT ADLT (ELECTROSURGICAL) ×1
ELECTRODE REM PT RTRN 9FT ADLT (ELECTROSURGICAL) ×1 IMPLANT
GAUZE XEROFORM 1X8 LF (GAUZE/BANDAGES/DRESSINGS) ×1 IMPLANT
GLOVE BIOGEL PI IND STRL 8.5 (GLOVE) ×1 IMPLANT
GLOVE SURG ORTHO 8.5 STRL (GLOVE) ×1 IMPLANT
GOWN STRL REUS W/ TWL LRG LVL3 (GOWN DISPOSABLE) ×1 IMPLANT
GOWN STRL REUS W/ TWL XL LVL3 (GOWN DISPOSABLE) ×1 IMPLANT
GOWN STRL REUS W/TWL LRG LVL3 (GOWN DISPOSABLE) ×1
GOWN STRL REUS W/TWL XL LVL3 (GOWN DISPOSABLE) ×1
GUIDEWIRE 3.2X400 (WIRE) IMPLANT
KIT PATIENT CARE HANA TABLE (KITS) ×1 IMPLANT
KIT TURNOVER CYSTO (KITS) ×1 IMPLANT
MANIFOLD NEPTUNE II (INSTRUMENTS) ×1 IMPLANT
MAT ABSORB  FLUID 56X50 GRAY (MISCELLANEOUS) ×1
MAT ABSORB FLUID 56X50 GRAY (MISCELLANEOUS) ×1 IMPLANT
NAIL CANN TFNA 9 130D 360 (Nail) IMPLANT
NDL SPNL 20GX3.5 QUINCKE YW (NEEDLE) ×1 IMPLANT
NEEDLE SPNL 20GX3.5 QUINCKE YW (NEEDLE) ×1 IMPLANT
NS IRRIG 1000ML POUR BTL (IV SOLUTION) ×1 IMPLANT
PACK HIP COMPR (MISCELLANEOUS) ×1 IMPLANT
STAPLER SKIN PROX 35W (STAPLE) ×1 IMPLANT
SUT VIC AB 0 CT1 36 (SUTURE) ×1 IMPLANT
SUT VIC AB 2-0 CT1 27 (SUTURE) ×2
SUT VIC AB 2-0 CT1 TAPERPNT 27 (SUTURE) ×2 IMPLANT
SYR 30ML LL (SYRINGE) ×1 IMPLANT
TOWEL OR 17X26 4PK STRL BLUE (TOWEL DISPOSABLE) ×1 IMPLANT
TRAP FLUID SMOKE EVACUATOR (MISCELLANEOUS) ×1 IMPLANT
WATER STERILE IRR 500ML POUR (IV SOLUTION) ×1 IMPLANT

## 2022-11-05 NOTE — Consult Note (Signed)
ORTHOPAEDIC CONSULTATION  REQUESTING PHYSICIAN: Jennye Boroughs, MD  Chief Complaint: right hip pain  HPI: Meagan Cain is a 77 y.o. female who complains of right hip pain after a fall. The pain is sharp in character. The pain is severe and 10/10. The pain is worse with movement and better with rest. Denies any numbness, tingling or constitutional symptoms.  Past Medical History:  Diagnosis Date   Arthritis    Cancer ([place])    skin cancer   Chronic vaginitis    Cystocele    Depression    Effusion, pericardium    History of shingles    History of skin cancer    Hyperlipemia    Hypertension    Hypothyroidism    Osteoporosis    Rectocele    Thoracic compression fracture ([place])    Urinary incontinence    Vaginal atrophy    Vitamin D deficiency    Past Surgical History:  Procedure Laterality Date   APPENDECTOMY     CHOLECYSTECTOMY  2003   COLONOSCOPY N/A 03/03/2018   Procedure: COLONOSCOPY;  Surgeon: Manya Silvas, MD;  Location: [facility] ENDOSCOPY;  Service: Endoscopy;  Laterality: N/A;   CYSTOCELE REPAIR  07/30/2015   Procedure: ANTERIOR REPAIR (CYSTOCELE);  Surgeon: Brayton Mars, MD;  Location: [facility] ORS;  Service: Gynecology;;   EYE SURGERY Bilateral    Cataract Extraction with IOL implants   TONSILLECTOMY     VAGINAL HYSTERECTOMY     Social History   Socioeconomic History   Marital status: Married    Spouse name: Not on file   Number of children: Not on file   Years of education: Not on file   Highest education level: Not on file  Occupational History   Not on file  Tobacco Use   Smoking status: Never   Smokeless tobacco: Never  Vaping Use   Vaping Use: Never used  Substance and Sexual Activity   Alcohol use: No    Alcohol/week: 0.0 standard drinks of alcohol   Drug use: No   Sexual activity: Yes    Partners: Female    Birth control/protection: Surgical  Other Topics Concern   Not on file  Social History Narrative   Not on file   Social  Determinants of Health   Financial Resource Strain: Not on file  Food Insecurity: Not on file  Transportation Needs: Not on file  Physical Activity: Not on file  Stress: Not on file  Social Connections: Not on file   Family History  Problem Relation Age of Onset   Heart disease Mother    Diabetes Mother    Heart disease Father    Throat cancer Maternal Grandmother    Heart disease Brother    Diabetes Brother    Cancer Neg Hx    Breast cancer Neg Hx    Ovarian cancer Neg Hx    Colon cancer Neg Hx    Allergies  Allergen Reactions   Ceftin [Cefuroxime Axetil]    Cephalosporins Nausea And Vomiting   Ibandronic Acid Nausea And Vomiting   Sulfa Antibiotics Rash   Prior to Admission medications   Medication Sig Start Date End Date Taking? Authorizing Provider  aspirin EC 81 MG tablet Take 81 mg by mouth daily.    Yes [provider]  Cholecalciferol (VITAMIN D3) 2000 UNITS capsule Take 1,000 Units by mouth daily.    Yes [provider]  conjugated estrogens (PREMARIN) vaginal cream Place 1 Applicatorful vaginally 2 (two) times a week. 1  gram two times a week 02/18/16  Yes Defrancesco, Alanda Slim, MD  Cyanocobalamin (RA VITAMIN B-12 TR) 1000 MCG TBCR Take 1,000 mcg by mouth daily.    Yes [provider]  levothyroxine (SYNTHROID) 75 MCG tablet Take 75 mcg by mouth daily before breakfast. 10/02/22 10/02/23 Yes [provider]  losartan (COZAAR) 100 MG tablet Take 100 mg by mouth daily.   Yes [provider]  potassium chloride (K-DUR) 10 MEQ tablet Take 10 mEq by mouth daily.  02/08/15  Yes [provider]  rosuvastatin (CRESTOR) 5 MG tablet Take 5 mg by mouth daily. 10/10/22  Yes [provider]  hydrALAZINE (APRESOLINE) 25 MG tablet Take by mouth. Patient not taking: Reported on 11/04/2022 08/07/22 08/07/23  [provider]  hydrochlorothiazide (HYDRODIURIL) 12.5 MG tablet Take by mouth. Patient not taking: Reported on  11/04/2022 08/07/22 08/07/23  [provider]   DG HIP UNILAT WITH PELVIS 2-3 VIEWS RIGHT  Result Date: 11/05/2022 CLINICAL DATA:  Known right hip fracture EXAM: DG HIP (WITH OR WITHOUT PELVIS) 2-3V RIGHT COMPARISON:  11/04/2021 FLUOROSCOPY TIME:  Radiation Exposure Index (as provided by the fluoroscopic device): Not available If the device does not provide the exposure index: Fluoroscopy Time:  27 seconds Number of Acquired Images:  4 FINDINGS: Medullary rod is noted within the right femur. Fixation screw is noted traversing the femoral neck. Fracture fragments are in near anatomic alignment. IMPRESSION: ORIF of proximal right femoral fracture. Electronically Signed   By: Inez Catalina M.D.   On: 11/05/2022 19:57   DG C-Arm 1-60 Min-No Report  Result Date: 11/05/2022 Fluoroscopy was utilized by the requesting physician.  No radiographic interpretation.   DG Chest 1 View  Result Date: 11/04/2022 CLINICAL DATA:  Pain after fall EXAM: CHEST  1 VIEW COMPARISON:  None Available. FINDINGS: No consolidation, pneumothorax or effusion. Enlarged cardiopericardial silhouette with tortuous aorta. No edema. Surgical clips in the right upper quadrant of the abdomen. Curvature and degenerative changes along the spine. IMPRESSION: No acute cardiopulmonary disease.  Enlarged heart Electronically Signed   By: Jill Side M.D.   On: 11/04/2022 18:42   DG Hip Unilat W or Wo Pelvis 2-3 Views Right  Result Date: 11/04/2022 CLINICAL DATA:  Pain after trauma EXAM: DG HIP (WITH OR WITHOUT PELVIS) 3V RIGHT COMPARISON:  None Available. FINDINGS: Mildly displaced intertrochanteric right hip fracture identified with some angulation. Osteopenia. No additional fracture or dislocation. Mild joint space loss of the right hip. Note is made of significant colonic stool in the midabdomen. There are some well rounded densities in the pelvis which are indeterminate although possibly vascular. IMPRESSION: Mildly displaced and  angulated intertrochanteric right hip fracture. Osteopenia. Degenerative change Electronically Signed   By: Jill Side M.D.   On: 11/04/2022 18:41   CT HEAD WO CONTRAST (5MM)  Result Date: 11/04/2022 CLINICAL DATA:  Fall EXAM: CT HEAD WITHOUT CONTRAST CT CERVICAL SPINE WITHOUT CONTRAST TECHNIQUE: Multidetector CT imaging of the head and cervical spine was performed following the standard protocol without intravenous contrast. Multiplanar CT image reconstructions of the cervical spine were also generated. RADIATION DOSE REDUCTION: This exam was performed according to the departmental dose-optimization program which includes automated exposure control, adjustment of the mA and/or kV according to patient size and/or use of iterative reconstruction technique. COMPARISON:  None Available. FINDINGS: CT HEAD FINDINGS Brain: No evidence of acute infarct, hemorrhage, mass, mass effect, or midline shift. No hydrocephalus or extra-axial fluid collection. Periventricular white matter changes, likely the sequela  of chronic small vessel ischemic disease. Remote lacunar infarcts in the bilateral basal ganglia. Vascular: No hyperdense vessel. Skull: Normal. Negative for fracture or focal lesion. Sinuses/Orbits: No acute finding. Other: The mastoid air cells are well aerated. CT CERVICAL SPINE FINDINGS Alignment: Trace retrolisthesis of C3 on C4 and trace anterolisthesis of C5 on C6, which appears facet mediated. Skull base and vertebrae: No acute fracture. No primary bone lesion or focal pathologic process. Partial osseous fusion across the left facets at C4 and C5. Soft tissues and spinal canal: No prevertebral fluid or swelling. No visible canal hematoma. Disc levels: Multilevel disc height loss. No high-grade spinal canal stenosis. Facet and uncovertebral hypertrophy. Upper chest: Negative. Other: None. IMPRESSION: 1. No acute intracranial process. 2. No acute fracture or traumatic listhesis in the cervical spine.  Electronically Signed   By: Merilyn Baba M.D.   On: 11/04/2022 18:34   CT Cervical Spine Wo Contrast  Result Date: 11/04/2022 CLINICAL DATA:  Fall EXAM: CT HEAD WITHOUT CONTRAST CT CERVICAL SPINE WITHOUT CONTRAST TECHNIQUE: Multidetector CT imaging of the head and cervical spine was performed following the standard protocol without intravenous contrast. Multiplanar CT image reconstructions of the cervical spine were also generated. RADIATION DOSE REDUCTION: This exam was performed according to the departmental dose-optimization program which includes automated exposure control, adjustment of the mA and/or kV according to patient size and/or use of iterative reconstruction technique. COMPARISON:  None Available. FINDINGS: CT HEAD FINDINGS Brain: No evidence of acute infarct, hemorrhage, mass, mass effect, or midline shift. No hydrocephalus or extra-axial fluid collection. Periventricular white matter changes, likely the sequela of chronic small vessel ischemic disease. Remote lacunar infarcts in the bilateral basal ganglia. Vascular: No hyperdense vessel. Skull: Normal. Negative for fracture or focal lesion. Sinuses/Orbits: No acute finding. Other: The mastoid air cells are well aerated. CT CERVICAL SPINE FINDINGS Alignment: Trace retrolisthesis of C3 on C4 and trace anterolisthesis of C5 on C6, which appears facet mediated. Skull base and vertebrae: No acute fracture. No primary bone lesion or focal pathologic process. Partial osseous fusion across the left facets at C4 and C5. Soft tissues and spinal canal: No prevertebral fluid or swelling. No visible canal hematoma. Disc levels: Multilevel disc height loss. No high-grade spinal canal stenosis. Facet and uncovertebral hypertrophy. Upper chest: Negative. Other: None. IMPRESSION: 1. No acute intracranial process. 2. No acute fracture or traumatic listhesis in the cervical spine. Electronically Signed   By: Merilyn Baba M.D.   On: 11/04/2022 18:34    Positive  ROS: All other systems have been reviewed and were otherwise negative with the exception of those mentioned in the HPI and as above.  Physical Exam: General: Alert, no acute distress Cardiovascular: No pedal edema Respiratory: No cyanosis, no use of accessory musculature GI: No organomegaly, abdomen is soft and non-tender Skin: No lesions in the area of chief complaint Neurologic: Sensation intact distally Psychiatric: Patient is competent for consent with normal mood and affect Lymphatic: No axillary or cervical lymphadenopathy  MUSCULOSKELETAL: right leg short, externally rotated, pain with IR/ER. Compartments soft. Good cap refill. Motor and sensory intact distally.  Assessment: Right hip intertrochanteric fracture, closed, displaced  Plan: Right hip trochanteric femoral nail.  The diagnosis, risks, benefits and alternatives to treatment are all discussed in detail with the patient and family. Risks include but are not limited to bleeding, infection, deep vein thrombosis, pulmonary embolism, nerve or vascular injury, non-union, repeat operation, persistent pain, weakness, stiffness and death. She understands and is eager to proceed.  Lovell Sheehan, MD    11/05/2022 8:11 PM

## 2022-11-05 NOTE — Anesthesia Postprocedure Evaluation (Signed)
Anesthesia Post Note  Patient: Meagan Cain  Procedure(s) Performed: INTRAMEDULLARY (IM) NAIL INTERTROCHANTERIC (Right)  Patient location during evaluation: PACU Anesthesia Type: General Level of consciousness: awake and alert, oriented and patient cooperative Pain management: pain level controlled Vital Signs Assessment: post-procedure vital signs reviewed and stable Respiratory status: spontaneous breathing, nonlabored ventilation and respiratory function stable Cardiovascular status: blood pressure returned to baseline and stable Postop Assessment: adequate PO intake Anesthetic complications: no   No notable events documented.   Last Vitals:  Vitals:   11/05/22 2115 11/05/22 2118  BP: 131/63   Pulse: 71 76  Resp: (!) 0 13  Temp: 37 C   SpO2: 95% 96%    Last Pain:  Vitals:   11/05/22 2118  TempSrc:   PainSc: [place]

## 2022-11-05 NOTE — Plan of Care (Signed)
  Problem: Clinical Measurements: Goal: Ability to maintain clinical measurements within normal limits will improve Outcome: Progressing   Problem: Clinical Measurements: Goal: Will remain free from infection Outcome: Progressing   Problem: Activity: Goal: Risk for activity intolerance will decrease Outcome: Progressing   Problem: Nutrition: Goal: Adequate nutrition will be maintained Outcome: Progressing   Problem: Pain Managment: Goal: General experience of comfort will improve Outcome: Progressing   Problem: Safety: Goal: Ability to remain free from injury will improve Outcome: Progressing

## 2022-11-05 NOTE — Anesthesia Preprocedure Evaluation (Addendum)
Anesthesia Evaluation  Patient identified by MRN, date of birth, ID band Patient awake    Reviewed: Allergy & Precautions, NPO status , Patient's Chart, lab work & pertinent test results  History of Anesthesia Complications Negative for: history of anesthetic complications  Airway Mallampati: IV   Neck ROM: Full    Dental  (+) Missing,    Pulmonary neg pulmonary ROS   Pulmonary exam normal breath sounds clear to auscultation       Cardiovascular hypertension, Normal cardiovascular exam Rhythm:Regular Rate:Normal  Hx pericardial effusion   ECG 11/05/22:  Normal sinus rhythm Low voltage QRS Septal infarct , age undetermined  Echo 10/25/20: NORMAL LEFT VENTRICULAR SYSTOLIC FUNCTION NORMAL RIGHT VENTRICULAR SYSTOLIC FUNCTION TRIVIAL REGURGITATION NOTED  NO VALVULAR STENOSIS TRIVIAL MR, TR EF >55%  Myocardial Perfusion 10/25/20: Normal myocardial fusion scan no evidence of stress-induced myocardial ischemia ejection fraction of 72% conclusion negative scan. This is a low risk study    Neuro/Psych  PSYCHIATRIC DISORDERS  Depression    negative neurological ROS     GI/Hepatic ,GERD  ,,  Endo/Other  Hypothyroidism    Renal/GU negative Renal ROS     Musculoskeletal  (+) Arthritis ,    Abdominal   Peds  Hematology negative hematology ROS (+)   Anesthesia Other Findings Cardiology note 08/07/22:  Right hip pain, tendon torn at right hip, is being followed by orthopedics Anginal equivalent, stable, continue current management Dyspnea on exertion, Resolved at this time. The patient's most recent echocardiogram from 10/2020 revealed normal LV systolic function with estimated EF of >55%. Her most recent nuclear stress test from 10/2020 revealed normal myocardial perfusion with no evidence of stress-induced myocardial ischemia. Continue primary prevention. Tachycardia, resolved at this time. Likely secondary to steroid  therapy.  Hypertension, chronic, reasonably controlled. Continue losartan. Hyperlipidemia, continue rosuvastatin therapy for lipid management Bilateral ankle edema, resolved at this time. Malaise and fatigue, chronic, unclear etiology but it is likely that age and sedentary lifestyle contributing factors Have patient follow up in 6 months  Return in about 6 months (around 02/05/2023).    Reproductive/Obstetrics                             Anesthesia Physical Anesthesia Plan  ASA: 3 and emergent  Anesthesia Plan: General   Post-op Pain Management:    Induction: Intravenous  PONV Risk Score and Plan: 3 and Treatment may vary due to age or medical condition, Ondansetron and Dexamethasone  Airway Management Planned: Oral ETT  Additional Equipment:   Intra-op Plan:   Post-operative Plan: Extubation in OR  Informed Consent: I have reviewed the patients History and Physical, chart, labs and discussed the procedure including the risks, benefits and alternatives for the proposed anesthesia with the patient or authorized representative who has indicated his/her understanding and acceptance.     Dental advisory given  Plan Discussed with: CRNA  Anesthesia Plan Comments: (No spinal due to patient preference and preoperative temperature greater than 38 F.  Discussed with Dr. Harlow Mares, he agrees surgery should proceed due to time of fracture and lack of other symptoms.  Patient consented for risks of anesthesia including but not limited to:  - adverse reactions to medications - damage to eyes, teeth, lips or other oral mucosa - nerve damage due to positioning  - sore throat or hoarseness - damage to heart, brain, nerves, lungs, other parts of body or loss of life  Informed patient about role of  CRNA in peri- and intra-operative care.  Patient voiced understanding.)        Anesthesia Quick Evaluation

## 2022-11-05 NOTE — ED Notes (Signed)
Secretary called for transport 

## 2022-11-05 NOTE — Progress Notes (Signed)
Progress Note    Meagan Cain  EZM:[id number] DOB: 09/05/1946  DOA: 11/04/2022 PCP: Idelle Crouch, MD      Brief Narrative:    Medical records reviewed and are as summarized below:  Meagan Cain is a 77 y.o. female with medical history significant for hypertension, hyperlipidemia, hypothyroidism, who presented to the hospital after ground-level fall.  She tripped and landed on her right hip while trying to get something out of a bottom cabinet.        Assessment/Plan:   Principal Problem:   Femur fracture (HCC) Active Problems:   Hypertension   Hypothyroidism   Hypokalemia   Body mass index is 27.59 kg/m.   Mildly displaced and angulated intertrochanteric right hip fracture, s/p mechanical fall: Continue analgesics as needed for pain.  Plan for surgical repair of right hip fracture.  Follow-up with orthopedic surgeon.  She is NPO.  Ordered IV fluids for hydration.   Chest tightness/discomfort: Resolved.  Troponins negative x 2.  EKG showed normal sinus rhythm, no acute ST-T changes.  Chest tightness could have been due to anxiety since patient said she was feeling anxious.   Hypokalemia: Improved.  Continue potassium supplement   Other comorbidities include hypertension, hypothyroidism   Diet Order             Diet NPO time specified  Diet effective midnight                            Consultants: Orthopedic surgeon  Procedures: Plan for right hip surgery    Medications:    levothyroxine  75 mcg Oral Q0600   [START ON 11/06/2022] losartan  100 mg Oral Daily   mupirocin ointment  1 Application Nasal BID   rosuvastatin  5 mg Oral Daily   Continuous Infusions:  lactated ringers 75 mL/hr at 11/05/22 0954   methocarbamol (ROBAXIN) IV Stopped (11/04/22 2109)     Anti-infectives (From admission, onward)    None              Family Communication/Anticipated D/C date and plan/Code Status   DVT prophylaxis:       Code Status: Full Code  Family Communication: Plan discussed with her husband and daughter-in-law at the bedside Disposition Plan: Plan to discharge to SNF in 3 to 5 days   Status is: Inpatient Remains inpatient appropriate because: Right hip fracture       Subjective:   Interval events noted.  She complains of pain in the right hip and dry mouth.  Dry mouth is nothing new..  No chest pain or shortness of breath.  Her husband and daughter-in-law were at the bedside.  Justyce, LPN, was also at the bedside.  Objective:    Vitals:   11/05/22 0317 11/05/22 0349 11/05/22 0402 11/05/22 0741  BP: 108/72 121/73 105/63 114/63  Pulse:  77 74 67  Resp: 16   16  Temp: 98 F (36.7 C)   98.4 F (36.9 C)  TempSrc: Oral     SpO2: 99%   100%  Weight:      Height:       No data found.   Intake/Output Summary (Last 24 hours) at 11/05/2022 1032 Last data filed at 11/05/2022 0604 Gross per 24 hour  Intake 51.19 ml  Output 0 ml  Net 51.19 ml   Filed Weights   11/04/22 1755  Weight: 66.2 kg    Exam:  GEN:  NAD SKIN: Warm and dry EYES: No pallor or icterus ENT: MMM CV: RRR PULM: CTA B ABD: soft, ND, NT, +BS CNS: AAO x 3, non focal EXT: Mild right hip tenderness        Data Reviewed:   I have personally reviewed following labs and imaging studies:  Labs: Labs show the following:   Basic Metabolic Panel: Recent Labs  Lab 11/04/22 1805 11/05/22 0306  NA 131* 131*  K 3.1* 3.9  CL 98 100  CO2 23 26  GLUCOSE 102* 142*  BUN 12 11  CREATININE 0.52 0.60  CALCIUM 8.6* 8.3*   GFR Estimated Creatinine Clearance: 51.3 mL/min (by C-G formula based on SCr of 0.6 mg/dL). Liver Function Tests: Recent Labs  Lab 11/04/22 1805  AST 29  ALT 29  ALKPHOS 62  BILITOT 0.7  PROT 6.7  ALBUMIN 3.7   No results for input(s): "LIPASE", "AMYLASE" in the last 168 hours. No results for input(s): "AMMONIA" in the last 168 hours. Coagulation profile No results for  input(s): "INR", "PROTIME" in the last 168 hours.  CBC: Recent Labs  Lab 11/04/22 1805 11/05/22 0306  WBC 9.7 9.5  NEUTROABS 5.4  --   HGB 11.9* 11.0*  HCT 35.0* 32.4*  MCV 88.6 88.0  PLT 292 256   Cardiac Enzymes: No results for input(s): "CKTOTAL", "CKMB", "CKMBINDEX", "TROPONINI" in the last 168 hours. BNP (last 3 results) No results for input(s): "PROBNP" in the last 8760 hours. CBG: No results for input(s): "GLUCAP" in the last 168 hours. D-Dimer: No results for input(s): "DDIMER" in the last 72 hours. Hgb A1c: No results for input(s): "HGBA1C" in the last 72 hours. Lipid Profile: No results for input(s): "CHOL", "HDL", "LDLCALC", "TRIG", "CHOLHDL", "LDLDIRECT" in the last 72 hours. Thyroid function studies: No results for input(s): "TSH", "T4TOTAL", "T3FREE", "THYROIDAB" in the last 72 hours.  Invalid input(s): "FREET3" Anemia work up: No results for input(s): "VITAMINB12", "FOLATE", "FERRITIN", "TIBC", "IRON", "RETICCTPCT" in the last 72 hours. Sepsis Labs: Recent Labs  Lab 11/04/22 1805 11/05/22 0306  WBC 9.7 9.5    Microbiology Recent Results (from the past 240 hour(s))  Surgical PCR screen     Status: None   Collection Time: 11/05/22  1:08 AM   Specimen: Nasal Mucosa; Nasal Swab  Result Value Ref Range Status   MRSA, PCR NEGATIVE NEGATIVE Final   Staphylococcus aureus NEGATIVE NEGATIVE Final    Comment: (NOTE) The Xpert SA Assay (FDA approved for NASAL specimens in patients 57 years of age and older), is one component of a comprehensive surveillance program. It is not intended to diagnose infection nor to guide or monitor treatment. Performed at [facility], [address] [place], [place] [address]     Procedures and diagnostic studies:  DG Chest 1 View  Result Date: 11/04/2022 CLINICAL DATA:  Pain after fall EXAM: CHEST  1 VIEW COMPARISON:  None Available. FINDINGS: No consolidation, pneumothorax or effusion. Enlarged  cardiopericardial silhouette with tortuous aorta. No edema. Surgical clips in the right upper quadrant of the abdomen. Curvature and degenerative changes along the spine. IMPRESSION: No acute cardiopulmonary disease.  Enlarged heart Electronically Signed   By: Jill Side M.D.   On: 11/04/2022 18:42   DG Hip Unilat W or Wo Pelvis 2-3 Views Right  Result Date: 11/04/2022 CLINICAL DATA:  Pain after trauma EXAM: DG HIP (WITH OR WITHOUT PELVIS) 3V RIGHT COMPARISON:  None Available. FINDINGS: Mildly displaced intertrochanteric right hip fracture identified with some angulation. Osteopenia. No  additional fracture or dislocation. Mild joint space loss of the right hip. Note is made of significant colonic stool in the midabdomen. There are some well rounded densities in the pelvis which are indeterminate although possibly vascular. IMPRESSION: Mildly displaced and angulated intertrochanteric right hip fracture. Osteopenia. Degenerative change Electronically Signed   By: Jill Side M.D.   On: 11/04/2022 18:41   CT HEAD WO CONTRAST (5MM)  Result Date: 11/04/2022 CLINICAL DATA:  Fall EXAM: CT HEAD WITHOUT CONTRAST CT CERVICAL SPINE WITHOUT CONTRAST TECHNIQUE: Multidetector CT imaging of the head and cervical spine was performed following the standard protocol without intravenous contrast. Multiplanar CT image reconstructions of the cervical spine were also generated. RADIATION DOSE REDUCTION: This exam was performed according to the departmental dose-optimization program which includes automated exposure control, adjustment of the mA and/or kV according to patient size and/or use of iterative reconstruction technique. COMPARISON:  None Available. FINDINGS: CT HEAD FINDINGS Brain: No evidence of acute infarct, hemorrhage, mass, mass effect, or midline shift. No hydrocephalus or extra-axial fluid collection. Periventricular white matter changes, likely the sequela of chronic small vessel ischemic disease. Remote  lacunar infarcts in the bilateral basal ganglia. Vascular: No hyperdense vessel. Skull: Normal. Negative for fracture or focal lesion. Sinuses/Orbits: No acute finding. Other: The mastoid air cells are well aerated. CT CERVICAL SPINE FINDINGS Alignment: Trace retrolisthesis of C3 on C4 and trace anterolisthesis of C5 on C6, which appears facet mediated. Skull base and vertebrae: No acute fracture. No primary bone lesion or focal pathologic process. Partial osseous fusion across the left facets at C4 and C5. Soft tissues and spinal canal: No prevertebral fluid or swelling. No visible canal hematoma. Disc levels: Multilevel disc height loss. No high-grade spinal canal stenosis. Facet and uncovertebral hypertrophy. Upper chest: Negative. Other: None. IMPRESSION: 1. No acute intracranial process. 2. No acute fracture or traumatic listhesis in the cervical spine. Electronically Signed   By: Merilyn Baba M.D.   On: 11/04/2022 18:34   CT Cervical Spine Wo Contrast  Result Date: 11/04/2022 CLINICAL DATA:  Fall EXAM: CT HEAD WITHOUT CONTRAST CT CERVICAL SPINE WITHOUT CONTRAST TECHNIQUE: Multidetector CT imaging of the head and cervical spine was performed following the standard protocol without intravenous contrast. Multiplanar CT image reconstructions of the cervical spine were also generated. RADIATION DOSE REDUCTION: This exam was performed according to the departmental dose-optimization program which includes automated exposure control, adjustment of the mA and/or kV according to patient size and/or use of iterative reconstruction technique. COMPARISON:  None Available. FINDINGS: CT HEAD FINDINGS Brain: No evidence of acute infarct, hemorrhage, mass, mass effect, or midline shift. No hydrocephalus or extra-axial fluid collection. Periventricular white matter changes, likely the sequela of chronic small vessel ischemic disease. Remote lacunar infarcts in the bilateral basal ganglia. Vascular: No hyperdense vessel.  Skull: Normal. Negative for fracture or focal lesion. Sinuses/Orbits: No acute finding. Other: The mastoid air cells are well aerated. CT CERVICAL SPINE FINDINGS Alignment: Trace retrolisthesis of C3 on C4 and trace anterolisthesis of C5 on C6, which appears facet mediated. Skull base and vertebrae: No acute fracture. No primary bone lesion or focal pathologic process. Partial osseous fusion across the left facets at C4 and C5. Soft tissues and spinal canal: No prevertebral fluid or swelling. No visible canal hematoma. Disc levels: Multilevel disc height loss. No high-grade spinal canal stenosis. Facet and uncovertebral hypertrophy. Upper chest: Negative. Other: None. IMPRESSION: 1. No acute intracranial process. 2. No acute fracture or traumatic listhesis in the cervical spine. Electronically  Signed   By: Merilyn Baba M.D.   On: 11/04/2022 18:34               LOS: 1 day   Naoki Migliaccio  [facility]   Pager on [email or web address]. If 7PM-7AM, please contact night-coverage at [email or web address]     11/05/2022, 10:32 AM

## 2022-11-05 NOTE — Progress Notes (Signed)
CROSS COVER NOTE  NAME: Meagan Cain MRN: [id number] DOB : [birth year] ATTENDING PHYSICIAN: Jose Persia, MD    Date of Service   11/05/2022   HPI/Events of Note   Message received from RN reporting chest pain.  M(r)s Meagan Cain is reporting 4/10 midsternal and (L) chest pain described as heaviness and squeezing that began this morning. Patient denies aggravating factors and reports SL nitroglycerin helped, bringing pain rating down to 2/10.The pain is not reproducible on palpation. She denies dyspnea, palpitations, dizziness, abdominal pain, nausea, vomiting, or radiation of pain.  Interventions   Assessment/Plan:  EKG--> NSR without ischemic changes Nitroglycerin SL Troponin - 2--> less than 2      To reach the provider On-Call:   7AM- 7PM see care teams to locate the attending and reach out to them via [email or web address] Password: [email or web address] 7PM-7AM contact night-coverage If you still have difficulty reaching the appropriate provider, please page the [facility] (Director on Call) for [facility] on amion for assistance  This document was prepared using Systems analyst and may include unintentional dictation errors.  Neomia Glass DNP, MBA, FNP-BC, PMHNP-BC Nurse Practitioner [facility] Pager [phone]

## 2022-11-05 NOTE — Op Note (Signed)
DATE OF SURGERY:  11/05/2022  TIME: 8:12 PM  PATIENT NAME:  Meagan Cain  AGE: 77 y.o.  PRE-OPERATIVE DIAGNOSIS:  Right Hip Fracture  POST-OPERATIVE DIAGNOSIS:  SAME  PROCEDURE:  Right INTRAMEDULLARY (IM) NAIL INTERTROCHANTERIC  SURGEON:  Lovell Sheehan  EBL:  50 cc  COMPLICATIONS:  none apparent  OPERATIVE IMPLANTS: Synthes trochanteric femoral nail  360 mm by 9 mm  with interlocking helical blade  85 mm  PREOPERATIVE INDICATIONS:  [name] is a 77 y.o. year old who fell and suffered a hip fracture. She was brought into the ER and then admitted and optimized and then elected for surgical intervention.    The risks benefits and alternatives were discussed with the patient including but not limited to the risks of nonoperative treatment, versus surgical intervention including infection, bleeding, nerve injury, malunion, nonunion, hardware prominence, hardware failure, need for hardware removal, blood clots, cardiopulmonary complications, morbidity, mortality, among others, and they were willing to proceed.    OPERATIVE PROCEDURE:  The patient was brought to the operating room and placed in the supine position.  General anesthesia was administered. She was placed on the fracture table.  Closed reduction was performed under C-arm guidance. The length of the femur was also measured using fluoroscopy. Time out was then performed after sterile prep and drape. She received preoperative antibiotics.  Incision was made proximal to the greater trochanter. A guidewire was placed in the appropriate position. Confirmation was made on AP and lateral views. The above-named nail was opened. I opened the proximal femur with a reamer. I then placed the nail by hand easily down. I did not need to ream the femur.  Once the nail was completely seated, I placed a guidepin into the femoral head into the center center position through a second incision.  I measured the length, and then reamed the  lateral cortex and up into the head. I then placed the helical blade. Slight compression was applied. Anatomic fixation achieved. Bone quality was poor.  I then secured the proximal interlock.  I then removed the instruments, and took final C-arm pictures AP and lateral the entire length of the leg. Anatomic reconstruction was achieved, and the wounds were irrigated copiously and closed with Vicryl  followed by staples and dry sterile dressing. Sponge and needle count were correct.   The patient was awakened and returned to PACU in stable and satisfactory condition. There no complications and the patient tolerated the procedure well.  She will be weightbearing as tolerated.    Lovell Sheehan

## 2022-11-05 NOTE — Transfer of Care (Signed)
Immediate Anesthesia Transfer of Care Note  Patient: Meagan Cain  Procedure(s) Performed: INTRAMEDULLARY (IM) NAIL INTERTROCHANTERIC (Right)  Patient Location: PACU  Anesthesia Type:General  Level of Consciousness: awake, alert , and oriented  Airway & Oxygen Therapy: Patient Spontanous Breathing and Patient connected to face mask oxygen  Post-op Assessment: Report given to RN, Post -op Vital signs reviewed and stable, and Patient moving all extremities  Post vital signs: Reviewed and stable  Last Vitals:  Vitals Value Taken Time  BP 152/72 11/05/22 2015  Temp    Pulse 89 11/05/22 2016  Resp 25 11/05/22 2016  SpO2 100 % 11/05/22 2016  Vitals shown include unvalidated device data.  Last Pain:  Vitals:   11/05/22 1849  TempSrc: Temporal  PainSc: 8       Patients Stated Pain Goal: 0 (61/90/12 2241)  Complications: No notable events documented.

## 2022-11-05 NOTE — Anesthesia Procedure Notes (Signed)
Procedure Name: Intubation Date/Time: 11/05/2022 7:06 PM  Performed by: Esaw Grandchild, CRNAPre-anesthesia Checklist: Patient identified, Emergency Drugs available, Suction available and Patient being monitored Patient Re-evaluated:Patient Re-evaluated prior to induction Oxygen Delivery Method: Circle system utilized Preoxygenation: Pre-oxygenation with 100% oxygen Induction Type: IV induction Laryngoscope Size: Miller and 2 Grade View: Grade I Tube type: Oral Tube size: 6.5 mm Number of attempts: 1 Airway Equipment and Method: Stylet, Oral airway and Bite block Placement Confirmation: ETT inserted through vocal cords under direct vision, positive ETCO2 and breath sounds checked- equal and bilateral Secured at: 20 cm Tube secured with: Tape Dental Injury: Teeth and Oropharynx as per pre-operative assessment

## 2022-11-06 ENCOUNTER — Encounter: Payer: Self-pay | Admitting: Orthopedic Surgery

## 2022-11-06 DIAGNOSIS — E876 Hypokalemia: Secondary | ICD-10-CM | POA: Diagnosis not present

## 2022-11-06 DIAGNOSIS — I10 Essential (primary) hypertension: Secondary | ICD-10-CM | POA: Diagnosis not present

## 2022-11-06 DIAGNOSIS — S72141A Displaced intertrochanteric fracture of right femur, initial encounter for closed fracture: Secondary | ICD-10-CM | POA: Diagnosis not present

## 2022-11-06 LAB — CBC WITH DIFFERENTIAL/PLATELET
Abs Immature Granulocytes: 0.05 10*3/uL (ref 0.00–0.07)
Basophils Absolute: 0 10*3/uL (ref 0.0–0.1)
Basophils Relative: 0 %
Eosinophils Absolute: 0 10*3/uL (ref 0.0–0.5)
Eosinophils Relative: 0 %
HCT: 32.3 % — ABNORMAL LOW (ref 36.0–46.0)
Hemoglobin: 10.9 g/dL — ABNORMAL LOW (ref 12.0–15.0)
Immature Granulocytes: 0 %
Lymphocytes Relative: 9 %
Lymphs Abs: 1.1 10*3/uL (ref 0.7–4.0)
MCH: 29.9 pg (ref 26.0–34.0)
MCHC: 33.7 g/dL (ref 30.0–36.0)
MCV: 88.7 fL (ref 80.0–100.0)
Monocytes Absolute: 0.7 10*3/uL (ref 0.1–1.0)
Monocytes Relative: 6 %
Neutro Abs: 10.1 10*3/uL — ABNORMAL HIGH (ref 1.7–7.7)
Neutrophils Relative %: 85 %
Platelets: 228 10*3/uL (ref 150–400)
RBC: 3.64 MIL/uL — ABNORMAL LOW (ref 3.87–5.11)
RDW: 13.2 % (ref 11.5–15.5)
WBC: 12 10*3/uL — ABNORMAL HIGH (ref 4.0–10.5)
nRBC: 0 % (ref 0.0–0.2)

## 2022-11-06 LAB — BASIC METABOLIC PANEL
Anion gap: 7 (ref 5–15)
BUN: 9 mg/dL (ref 8–23)
CO2: 26 mmol/L (ref 22–32)
Calcium: 8.2 mg/dL — ABNORMAL LOW (ref 8.9–10.3)
Chloride: 96 mmol/L — ABNORMAL LOW (ref 98–111)
Creatinine, Ser: 0.52 mg/dL (ref 0.44–1.00)
GFR, Estimated: >60 mL/min (ref >60)
Glucose, Bld: 138 mg/dL — ABNORMAL HIGH (ref 70–99)
Potassium: 3.7 mmol/L (ref 3.5–5.1)
Sodium: 129 mmol/L — ABNORMAL LOW (ref 135–145)

## 2022-11-06 LAB — GLUCOSE, CAPILLARY
Glucose-Capillary: 140 mg/dL — ABNORMAL HIGH (ref 70–99)
Glucose-Capillary: 157 mg/dL — ABNORMAL HIGH (ref 70–99)

## 2022-11-06 LAB — MAGNESIUM: Magnesium: 2 mg/dL (ref 1.7–2.4)

## 2022-11-06 MED ORDER — DOCUSATE SODIUM 100 MG PO CAPS: 100.0000 mg | ORAL_CAPSULE | Freq: Two times a day (BID) | ORAL | 0 refills | Status: AC

## 2022-11-06 MED ORDER — POTASSIUM CHLORIDE CRYS ER 10 MEQ PO TBCR
10.0000 meq | EXTENDED_RELEASE_TABLET | Freq: Every day | ORAL | Status: DC
  Administered 2022-11-06 – 2022-11-08 (×3): 10 meq via ORAL
  Filled 2022-11-06 (×3): qty 1

## 2022-11-06 MED ORDER — ASPIRIN EC 81 MG PO TBEC: 81.0000 mg | DELAYED_RELEASE_TABLET | Freq: Two times a day (BID) | ORAL | 11 refills | Status: AC

## 2022-11-06 MED ORDER — METHOCARBAMOL 500 MG PO TABS: 500.0000 mg | ORAL_TABLET | Freq: Three times a day (TID) | ORAL | 0 refills | Status: DC | PRN

## 2022-11-06 MED ORDER — HYDROCODONE-ACETAMINOPHEN 5-325 MG PO TABS: 1.0000 | ORAL_TABLET | Freq: Four times a day (QID) | ORAL | 0 refills | Status: DC | PRN

## 2022-11-06 NOTE — Plan of Care (Signed)
  Problem: Clinical Measurements: Goal: Ability to maintain clinical measurements within normal limits will improve Outcome: Progressing   Problem: Clinical Measurements: Goal: Will remain free from infection Outcome: Progressing   Problem: Clinical Measurements: Goal: Respiratory complications will improve Outcome: Progressing   Problem: Activity: Goal: Risk for activity intolerance will decrease Outcome: Progressing   Problem: Skin Integrity: Goal: Risk for impaired skin integrity will decrease Outcome: Progressing   Problem: Safety: Goal: Ability to remain free from injury will improve Outcome: Progressing

## 2022-11-06 NOTE — Progress Notes (Addendum)
Progress Note    Meagan Cain  LEX:[id number] DOB: [birth year]  DOA: 11/04/2022 PCP: Idelle Crouch, MD      Brief Narrative:    Medical records reviewed and are as summarized below:  Meagan Cain is a 77 y.o. female with medical history significant for hypertension, hyperlipidemia, hypothyroidism, who presented to the hospital after ground-level fall.  She tripped and landed on her right hip while trying to get something out of a bottom cabinet.        Assessment/Plan:   Principal Problem:   Femur fracture (HCC) Active Problems:   Hypertension   Hyponatremia   Hypothyroidism   Hypokalemia   Body mass index is 27.59 kg/m.   Mildly displaced and angulated intertrochanteric right hip fracture, s/p mechanical fall: S/p right intramedullary nail intertrochanteric on 11/05/2022.  Continue analgesics as needed for pain.  PT recommended home health therapy.  Monitor CBC   Chest tightness/discomfort: Resolved.  Troponins negative x 2.  EKG showed normal sinus rhythm, no acute ST-T changes.  Chest tightness could have been due to anxiety since patient said she was feeling anxious.   Constipation: Continue Senokot and MiraLAX.  Add Dulcolax suppository as needed.   Hyponatremia: Asymptomatic.  Monitor sodium level   Hypokalemia: Improved.  Continue home dose of potassium chloride 10 mEq daily.     Other comorbidities include hypertension (HCTZ on hold), hypothyroidism   Diet Order             Diet regular Room service appropriate? Yes; Fluid consistency: Thin  Diet effective now                            Consultants: Orthopedic surgeon  Procedures: Plan for right hip surgery    Medications:    docusate sodium  100 mg Oral BID   levothyroxine  75 mcg Oral Q0600   losartan  100 mg Oral Daily   potassium chloride  10 mEq Oral Daily   rosuvastatin  5 mg Oral Daily   Continuous Infusions:  clindamycin (CLEOCIN) IV      methocarbamol (ROBAXIN) IV     tranexamic acid       Anti-infectives (From admission, onward)    Start     Dose/Rate Route Frequency Ordered Stop   11/06/22 0000  ceFAZolin (ANCEF) IVPB 2g/100 mL premix        2 g 200 mL/hr over 30 Minutes Intravenous Every 8 hours 11/05/22 2315 11/06/22 0915   11/05/22 1545  clindamycin (CLEOCIN) IVPB 600 mg        600 mg 100 mL/hr over 30 Minutes Intravenous 30 min pre-op 11/05/22 1548                Family Communication/Anticipated D/C date and plan/Code Status   DVT prophylaxis: SCDs Start: 11/05/22 2315     Code Status: Full Code  Family Communication: Plan discussed with Renee, daughter, at the bedside Disposition Plan: Plan to discharge to home in 1 to 2 days   Status is: Inpatient Remains inpatient appropriate because: S/p right hip surgery       Subjective:   Interval events noted.  She complains of constipation and pain in the right hip but otherwise she feels well.  Renee, daughter, was at the bedside.  Objective:    Vitals:   11/05/22 2141 11/06/22 0209 11/06/22 0810 11/06/22 1157  BP: 127/72 (!) 107/54 132/74 115/71  Pulse: 83 81  68 73  Resp: '17 17 17 18  '$ Temp: 98.1 F (36.7 C) (!) 97.5 F (36.4 C) 97.8 F (36.6 C) 98.7 F (37.1 C)  TempSrc:   Oral   SpO2: 98% 94% 96% 98%  Weight:      Height:       No data found.   Intake/Output Summary (Last 24 hours) at 11/06/2022 1444 Last data filed at 11/06/2022 0500 Gross per 24 hour  Intake 1416.83 ml  Output 950 ml  Net 466.83 ml   Filed Weights   11/04/22 1755  Weight: 66.2 kg    Exam:  GEN: NAD SKIN: Warm and dry EYES: No pallor or icterus ENT: MMM CV: RRR PULM: CTA B ABD: soft, ND, NT, +BS CNS: AAO x 3, non focal EXT: Mild right hip swelling and tenderness.  Dressing on right hip surgical incisional wound is clean, dry and intact      Data Reviewed:   I have personally reviewed following labs and imaging studies:  Labs: Labs show  the following:   Basic Metabolic Panel: Recent Labs  Lab 11/04/22 1805 11/05/22 0306 11/06/22 0828  NA 131* 131* 129*  K 3.1* 3.9 3.7  CL 98 100 96*  CO2 '23 26 26  '$ GLUCOSE 102* 142* 138*  BUN '12 11 9  '$ CREATININE 0.52 0.60 0.52  CALCIUM 8.6* 8.3* 8.2*  MG  --   --  2.0   GFR Estimated Creatinine Clearance: 50.5 mL/min (by C-G formula based on SCr of 0.52 mg/dL). Liver Function Tests: Recent Labs  Lab 11/04/22 1805  AST 29  ALT 29  ALKPHOS 62  BILITOT 0.7  PROT 6.7  ALBUMIN 3.7   No results for input(s): "LIPASE", "AMYLASE" in the last 168 hours. No results for input(s): "AMMONIA" in the last 168 hours. Coagulation profile No results for input(s): "INR", "PROTIME" in the last 168 hours.  CBC: Recent Labs  Lab 11/04/22 1805 11/05/22 0306 11/06/22 0828  WBC 9.7 9.5 12.0*  NEUTROABS 5.4  --  10.1*  HGB 11.9* 11.0* 10.9*  HCT 35.0* 32.4* 32.3*  MCV 88.6 88.0 88.7  PLT 292 256 228   Cardiac Enzymes: No results for input(s): "CKTOTAL", "CKMB", "CKMBINDEX", "TROPONINI" in the last 168 hours. BNP (last 3 results) No results for input(s): "PROBNP" in the last 8760 hours. CBG: Recent Labs  Lab 11/05/22 1537 11/06/22 0810 11/06/22 1122  GLUCAP 88 140* 157*   D-Dimer: No results for input(s): "DDIMER" in the last 72 hours. Hgb A1c: No results for input(s): "HGBA1C" in the last 72 hours. Lipid Profile: No results for input(s): "CHOL", "HDL", "LDLCALC", "TRIG", "CHOLHDL", "LDLDIRECT" in the last 72 hours. Thyroid function studies: No results for input(s): "TSH", "T4TOTAL", "T3FREE", "THYROIDAB" in the last 72 hours.  Invalid input(s): "FREET3" Anemia work up: No results for input(s): "VITAMINB12", "FOLATE", "FERRITIN", "TIBC", "IRON", "RETICCTPCT" in the last 72 hours. Sepsis Labs: Recent Labs  Lab 11/04/22 1805 11/05/22 0306 11/06/22 0828  WBC 9.7 9.5 12.0*    Microbiology Recent Results (from the past 240 hour(s))  Surgical PCR screen     Status:  None   Collection Time: 11/05/22  1:08 AM   Specimen: Nasal Mucosa; Nasal Swab  Result Value Ref Range Status   MRSA, PCR NEGATIVE NEGATIVE Final   Staphylococcus aureus NEGATIVE NEGATIVE Final    Comment: (NOTE) The Xpert SA Assay (FDA approved for NASAL specimens in patients 46 years of age and older), is one component of a comprehensive surveillance program. It is  not intended to diagnose infection nor to guide or monitor treatment. Performed at [facility], [address] [place], [place] [address]     Procedures and diagnostic studies:  DG HIP UNILAT WITH PELVIS 2-3 VIEWS RIGHT  Result Date: 11/05/2022 CLINICAL DATA:  Known right hip fracture EXAM: DG HIP (WITH OR WITHOUT PELVIS) 2-3V RIGHT COMPARISON:  11/04/2021 FLUOROSCOPY TIME:  Radiation Exposure Index (as provided by the fluoroscopic device): Not available If the device does not provide the exposure index: Fluoroscopy Time:  27 seconds Number of Acquired Images:  4 FINDINGS: Medullary rod is noted within the right femur. Fixation screw is noted traversing the femoral neck. Fracture fragments are in near anatomic alignment. IMPRESSION: ORIF of proximal right femoral fracture. Electronically Signed   By: Inez Catalina M.D.   On: 11/05/2022 19:57   DG C-Arm 1-60 Min-No Report  Result Date: 11/05/2022 Fluoroscopy was utilized by the requesting physician.  No radiographic interpretation.   DG Chest 1 View  Result Date: 11/04/2022 CLINICAL DATA:  Pain after fall EXAM: CHEST  1 VIEW COMPARISON:  None Available. FINDINGS: No consolidation, pneumothorax or effusion. Enlarged cardiopericardial silhouette with tortuous aorta. No edema. Surgical clips in the right upper quadrant of the abdomen. Curvature and degenerative changes along the spine. IMPRESSION: No acute cardiopulmonary disease.  Enlarged heart Electronically Signed   By: Jill Side M.D.   On: 11/04/2022 18:42   DG Hip Unilat W or Wo Pelvis 2-3 Views  Right  Result Date: 11/04/2022 CLINICAL DATA:  Pain after trauma EXAM: DG HIP (WITH OR WITHOUT PELVIS) 3V RIGHT COMPARISON:  None Available. FINDINGS: Mildly displaced intertrochanteric right hip fracture identified with some angulation. Osteopenia. No additional fracture or dislocation. Mild joint space loss of the right hip. Note is made of significant colonic stool in the midabdomen. There are some well rounded densities in the pelvis which are indeterminate although possibly vascular. IMPRESSION: Mildly displaced and angulated intertrochanteric right hip fracture. Osteopenia. Degenerative change Electronically Signed   By: Jill Side M.D.   On: 11/04/2022 18:41   CT HEAD WO CONTRAST (5MM)  Result Date: 11/04/2022 CLINICAL DATA:  Fall EXAM: CT HEAD WITHOUT CONTRAST CT CERVICAL SPINE WITHOUT CONTRAST TECHNIQUE: Multidetector CT imaging of the head and cervical spine was performed following the standard protocol without intravenous contrast. Multiplanar CT image reconstructions of the cervical spine were also generated. RADIATION DOSE REDUCTION: This exam was performed according to the departmental dose-optimization program which includes automated exposure control, adjustment of the mA and/or kV according to patient size and/or use of iterative reconstruction technique. COMPARISON:  None Available. FINDINGS: CT HEAD FINDINGS Brain: No evidence of acute infarct, hemorrhage, mass, mass effect, or midline shift. No hydrocephalus or extra-axial fluid collection. Periventricular white matter changes, likely the sequela of chronic small vessel ischemic disease. Remote lacunar infarcts in the bilateral basal ganglia. Vascular: No hyperdense vessel. Skull: Normal. Negative for fracture or focal lesion. Sinuses/Orbits: No acute finding. Other: The mastoid air cells are well aerated. CT CERVICAL SPINE FINDINGS Alignment: Trace retrolisthesis of C3 on C4 and trace anterolisthesis of C5 on C6, which appears facet  mediated. Skull base and vertebrae: No acute fracture. No primary bone lesion or focal pathologic process. Partial osseous fusion across the left facets at C4 and C5. Soft tissues and spinal canal: No prevertebral fluid or swelling. No visible canal hematoma. Disc levels: Multilevel disc height loss. No high-grade spinal canal stenosis. Facet and uncovertebral hypertrophy. Upper chest: Negative. Other: None. IMPRESSION: 1.  No acute intracranial process. 2. No acute fracture or traumatic listhesis in the cervical spine. Electronically Signed   By: Merilyn Baba M.D.   On: 11/04/2022 18:34   CT Cervical Spine Wo Contrast  Result Date: 11/04/2022 CLINICAL DATA:  Fall EXAM: CT HEAD WITHOUT CONTRAST CT CERVICAL SPINE WITHOUT CONTRAST TECHNIQUE: Multidetector CT imaging of the head and cervical spine was performed following the standard protocol without intravenous contrast. Multiplanar CT image reconstructions of the cervical spine were also generated. RADIATION DOSE REDUCTION: This exam was performed according to the departmental dose-optimization program which includes automated exposure control, adjustment of the mA and/or kV according to patient size and/or use of iterative reconstruction technique. COMPARISON:  None Available. FINDINGS: CT HEAD FINDINGS Brain: No evidence of acute infarct, hemorrhage, mass, mass effect, or midline shift. No hydrocephalus or extra-axial fluid collection. Periventricular white matter changes, likely the sequela of chronic small vessel ischemic disease. Remote lacunar infarcts in the bilateral basal ganglia. Vascular: No hyperdense vessel. Skull: Normal. Negative for fracture or focal lesion. Sinuses/Orbits: No acute finding. Other: The mastoid air cells are well aerated. CT CERVICAL SPINE FINDINGS Alignment: Trace retrolisthesis of C3 on C4 and trace anterolisthesis of C5 on C6, which appears facet mediated. Skull base and vertebrae: No acute fracture. No primary bone lesion or  focal pathologic process. Partial osseous fusion across the left facets at C4 and C5. Soft tissues and spinal canal: No prevertebral fluid or swelling. No visible canal hematoma. Disc levels: Multilevel disc height loss. No high-grade spinal canal stenosis. Facet and uncovertebral hypertrophy. Upper chest: Negative. Other: None. IMPRESSION: 1. No acute intracranial process. 2. No acute fracture or traumatic listhesis in the cervical spine. Electronically Signed   By: Merilyn Baba M.D.   On: 11/04/2022 18:34               LOS: 2 days   Syan Cullimore  [facility]   Pager on [email or web address]. If 7PM-7AM, please contact night-coverage at [email or web address]     11/06/2022, 2:44 PM

## 2022-11-06 NOTE — Progress Notes (Signed)
Patient is not able to walk the distance required to go the bathroom, or he/she is unable to safely negotiate stairs required to access the bathroom.  A 3in1 BSC will alleviate this problem  

## 2022-11-06 NOTE — Progress Notes (Signed)
Subjective:  Patient reports pain as mild to moderate.    Objective:   VITALS:   Vitals:   11/05/22 2115 11/05/22 2118 11/05/22 2141 11/06/22 0209  BP: 131/63  127/72 (!) 107/54  Pulse: 71 76 83 81  Resp: (!) 0 '13 17 17  '$ Temp: 98.6 F (37 C)  98.1 F (36.7 C) (!) 97.5 F (36.4 C)  TempSrc:      SpO2: 95% 96% 98% 94%  Weight:      Height:        PHYSICAL EXAM:  Neurologically intact ABD soft Neurovascular intact Sensation intact distally Intact pulses distally Dorsiflexion/Plantar flexion intact Incision: dressing C/D/I No cellulitis present Compartment soft  LABS  Results for orders placed or performed during the hospital encounter of 11/04/22 (from the past 24 hour(s))  Glucose, capillary     Status: None   Collection Time: 11/05/22  3:37 PM  Result Value Ref Range   Glucose-Capillary 88 70 - 99 mg/dL    DG HIP UNILAT WITH PELVIS 2-3 VIEWS RIGHT  Result Date: 11/05/2022 CLINICAL DATA:  Known right hip fracture EXAM: DG HIP (WITH OR WITHOUT PELVIS) 2-3V RIGHT COMPARISON:  11/04/2021 FLUOROSCOPY TIME:  Radiation Exposure Index (as provided by the fluoroscopic device): Not available If the device does not provide the exposure index: Fluoroscopy Time:  27 seconds Number of Acquired Images:  4 FINDINGS: Medullary rod is noted within the right femur. Fixation screw is noted traversing the femoral neck. Fracture fragments are in near anatomic alignment. IMPRESSION: ORIF of proximal right femoral fracture. Electronically Signed   By: Inez Catalina M.D.   On: 11/05/2022 19:57   DG C-Arm 1-60 Min-No Report  Result Date: 11/05/2022 Fluoroscopy was utilized by the requesting physician.  No radiographic interpretation.   DG Chest 1 View  Result Date: 11/04/2022 CLINICAL DATA:  Pain after fall EXAM: CHEST  1 VIEW COMPARISON:  None Available. FINDINGS: No consolidation, pneumothorax or effusion. Enlarged cardiopericardial silhouette with tortuous aorta. No edema. Surgical  clips in the right upper quadrant of the abdomen. Curvature and degenerative changes along the spine. IMPRESSION: No acute cardiopulmonary disease.  Enlarged heart Electronically Signed   By: Jill Side M.D.   On: 11/04/2022 18:42   DG Hip Unilat W or Wo Pelvis 2-3 Views Right  Result Date: 11/04/2022 CLINICAL DATA:  Pain after trauma EXAM: DG HIP (WITH OR WITHOUT PELVIS) 3V RIGHT COMPARISON:  None Available. FINDINGS: Mildly displaced intertrochanteric right hip fracture identified with some angulation. Osteopenia. No additional fracture or dislocation. Mild joint space loss of the right hip. Note is made of significant colonic stool in the midabdomen. There are some well rounded densities in the pelvis which are indeterminate although possibly vascular. IMPRESSION: Mildly displaced and angulated intertrochanteric right hip fracture. Osteopenia. Degenerative change Electronically Signed   By: Jill Side M.D.   On: 11/04/2022 18:41   CT HEAD WO CONTRAST (5MM)  Result Date: 11/04/2022 CLINICAL DATA:  Fall EXAM: CT HEAD WITHOUT CONTRAST CT CERVICAL SPINE WITHOUT CONTRAST TECHNIQUE: Multidetector CT imaging of the head and cervical spine was performed following the standard protocol without intravenous contrast. Multiplanar CT image reconstructions of the cervical spine were also generated. RADIATION DOSE REDUCTION: This exam was performed according to the departmental dose-optimization program which includes automated exposure control, adjustment of the mA and/or kV according to patient size and/or use of iterative reconstruction technique. COMPARISON:  None Available. FINDINGS: CT HEAD FINDINGS Brain: No evidence of acute infarct, hemorrhage, mass, mass effect,  or midline shift. No hydrocephalus or extra-axial fluid collection. Periventricular white matter changes, likely the sequela of chronic small vessel ischemic disease. Remote lacunar infarcts in the bilateral basal ganglia. Vascular: No hyperdense  vessel. Skull: Normal. Negative for fracture or focal lesion. Sinuses/Orbits: No acute finding. Other: The mastoid air cells are well aerated. CT CERVICAL SPINE FINDINGS Alignment: Trace retrolisthesis of C3 on C4 and trace anterolisthesis of C5 on C6, which appears facet mediated. Skull base and vertebrae: No acute fracture. No primary bone lesion or focal pathologic process. Partial osseous fusion across the left facets at C4 and C5. Soft tissues and spinal canal: No prevertebral fluid or swelling. No visible canal hematoma. Disc levels: Multilevel disc height loss. No high-grade spinal canal stenosis. Facet and uncovertebral hypertrophy. Upper chest: Negative. Other: None. IMPRESSION: 1. No acute intracranial process. 2. No acute fracture or traumatic listhesis in the cervical spine. Electronically Signed   By: Merilyn Baba M.D.   On: 11/04/2022 18:34   CT Cervical Spine Wo Contrast  Result Date: 11/04/2022 CLINICAL DATA:  Fall EXAM: CT HEAD WITHOUT CONTRAST CT CERVICAL SPINE WITHOUT CONTRAST TECHNIQUE: Multidetector CT imaging of the head and cervical spine was performed following the standard protocol without intravenous contrast. Multiplanar CT image reconstructions of the cervical spine were also generated. RADIATION DOSE REDUCTION: This exam was performed according to the departmental dose-optimization program which includes automated exposure control, adjustment of the mA and/or kV according to patient size and/or use of iterative reconstruction technique. COMPARISON:  None Available. FINDINGS: CT HEAD FINDINGS Brain: No evidence of acute infarct, hemorrhage, mass, mass effect, or midline shift. No hydrocephalus or extra-axial fluid collection. Periventricular white matter changes, likely the sequela of chronic small vessel ischemic disease. Remote lacunar infarcts in the bilateral basal ganglia. Vascular: No hyperdense vessel. Skull: Normal. Negative for fracture or focal lesion. Sinuses/Orbits: No  acute finding. Other: The mastoid air cells are well aerated. CT CERVICAL SPINE FINDINGS Alignment: Trace retrolisthesis of C3 on C4 and trace anterolisthesis of C5 on C6, which appears facet mediated. Skull base and vertebrae: No acute fracture. No primary bone lesion or focal pathologic process. Partial osseous fusion across the left facets at C4 and C5. Soft tissues and spinal canal: No prevertebral fluid or swelling. No visible canal hematoma. Disc levels: Multilevel disc height loss. No high-grade spinal canal stenosis. Facet and uncovertebral hypertrophy. Upper chest: Negative. Other: None. IMPRESSION: 1. No acute intracranial process. 2. No acute fracture or traumatic listhesis in the cervical spine. Electronically Signed   By: Merilyn Baba M.D.   On: 11/04/2022 18:34    Assessment/Plan: 1 Day Post-Op   Principal Problem:   Femur fracture (HCC) Active Problems:   Hypertension   Hypothyroidism   Hypokalemia   Up with therapy Hydrocodone prn for pain Aspirin 81 mg twice daily for 30 days WBAT RLE  Discharge per medicine Follow up in office in 2 weeks ( FEB 13 or 14 with Carlynn Spry PA-C  ) call to confirm appointment [place] [id number]  Carlynn Spry , PA-C 11/06/2022, 6:59 AM

## 2022-11-06 NOTE — Progress Notes (Signed)
Physical Therapy Treatment Patient Details Name: Meagan Cain MRN: [id number] DOB: 1946-10-05 Today's Date: 11/06/2022   History of Present Illness Meagan Cain is a77yoF who comes to [facility] on 11/04/22 after a fall with subsequent hip pain. PMH: HTN, HLD, hypoTSH. X-ray with mildly displaced and angulated intertrochanteric right hip fracture. Pt underwent Rt femur IMN fixation c Dr Harlow Mares on 11/05/22. Pt is WBAT.    PT Comments    Pt was sitting in recliner, with family member at bedside, upon arrival. She agrees to session but endorses severe pain. " I did too much earlier." Chief Strategy Officer educated pt on importance of mobility and that it is not unusual to have more pain at this state of recovery. She requested to return to bed. CGA for transfer to RW and take a few steps to EOB. Min assist to progress to supine from EOB short sit. Once in bed, author issued HEP handout and pt performed. Overall tolerated well but is limited by pain. Recommend HHPT at DC to maximize independence while assisting pt to PLOF.   Recommendations for follow up therapy are one component of a multi-disciplinary discharge planning process, led by the attending physician.  Recommendations may be updated based on patient status, additional functional criteria and insurance authorization.  Follow Up Recommendations  Home health PT     Assistance Recommended at Discharge Intermittent Supervision/Assistance  Patient can return home with the following A little help with walking and/or transfers;Assistance with cooking/housework;Assist for transportation;Help with stairs or ramp for entrance   Equipment Recommendations  Rolling walker (2 wheels);BSC/3in1       Precautions / Restrictions Precautions Precautions: Fall Restrictions Weight Bearing Restrictions: Yes RLE Weight Bearing: Weight bearing as tolerated     Mobility  Bed Mobility Overal bed mobility: Needs Assistance Bed Mobility: Sit to Supine  Sit to supine: Min  assist General bed mobility comments: Pt uses non surgical LE to assist surgical LE into bed. Also required min assist to safely achieve supine    Transfers Overall transfer level: Needs assistance Equipment used: Rolling walker (2 wheels) Transfers: Sit to/from Stand Sit to Stand: Min guard      General transfer comment: CGA for safety with vcs for handplacement, technique, and overall improved sequencing    Ambulation/Gait Ambulation/Gait assistance: Min guard Gait Distance (Feet): 5 Feet Assistive device: Rolling walker (2 wheels) Gait Pattern/deviations: Step-to pattern       General Gait Details: antalgic, heavy use of BUE. unwilling to ambulate greater distances this afternoon 2/2 to increased pain        Cognition Arousal/Alertness: Awake/alert Behavior During Therapy: WFL for tasks assessed/performed Overall Cognitive Status: Within Functional Limits for tasks assessed    General Comments: Pt is A and O x 4        Exercises General Exercises - Lower Extremity Ankle Circles/Pumps: AROM, 10 reps Quad Sets: AROM, 10 reps Gluteal Sets: 10 reps Heel Slides: AROM, 10 reps Hip ABduction/ADduction: AROM, 10 reps Straight Leg Raises: AAROM, 5 reps        Pertinent Vitals/Pain Pain Assessment Pain Assessment: 0-10 Pain Score: 8  Pain Location: Rt groin/upper adductors Pain Descriptors / Indicators: Aching Pain Intervention(s): Limited activity within patient's tolerance, Monitored during session, Premedicated before session, Repositioned     PT Goals (current goals can now be found in the care plan section) Acute Rehab PT Goals Patient Stated Goal: less pain Progress towards PT goals: Progressing toward goals    Frequency    BID  PT Plan Current plan remains appropriate       AM-PAC PT "6 Clicks" Mobility   Outcome Measure  Help needed turning from your back to your side while in a flat bed without using bedrails?: A Little Help needed  moving from lying on your back to sitting on the side of a flat bed without using bedrails?: A Little Help needed moving to and from a bed to a chair (including a wheelchair)?: A Little Help needed standing up from a chair using your arms (e.g., wheelchair or bedside chair)?: A Little Help needed to walk in hospital room?: A Little Help needed climbing 3-5 steps with a railing? : A Lot 6 Click Score: 17    End of Session   Activity Tolerance: Patient limited by pain Patient left: in bed;with call bell/phone within reach;with bed alarm set;with family/visitor present Nurse Communication: Mobility status PT Visit Diagnosis: Unsteadiness on feet (R26.81);Difficulty in walking, not elsewhere classified (R26.2);Other abnormalities of gait and mobility (R26.89);History of falling (Z91.81)     Time: 1886-7737 PT Time Calculation (min) (ACUTE ONLY): 17 min  Charges:  $Therapeutic Exercise: 8-22 mins                     Julaine Fusi PTA 11/06/22, 5:16 PM

## 2022-11-06 NOTE — Evaluation (Signed)
Physical Therapy Evaluation Patient Details Name: Meagan Cain MRN: [id number] DOB: Feb 07, 1946 Today's Date: 11/06/2022  History of Present Illness  Meagan Cain is a77yoF who comes to [facility] on 11/04/22 after a fall with subsequent hip pain. PMH: HTN, HLD, hypoTSH. X-ray with mildly displaced and angulated intertrochanteric right hip fracture. Pt underwent Rt femur IMN fixation c Dr Harlow Mares on 11/05/22. Pt is WBAT.  Clinical Impression  Pt in chair on arrival, no pain when resting, but elevation to 10/10 with mobility. Meds received prior to entry. Pt able to rise to standing, AMB to doorway and return to sitting in recliner without physical assistance, however pt's performance warrants minGuard assist due to high levels of antalgia and difficult to anticipate sharp fluctuations in postural control. DTR in room for session provides encouragement. WIll continue to follow. Pt well poised to DC direct to home with HHPT and support from family.        Recommendations for follow up therapy are one component of a multi-disciplinary discharge planning process, led by the attending physician.  Recommendations may be updated based on patient status, additional functional criteria and insurance authorization.  Follow Up Recommendations Home health PT      Assistance Recommended at Discharge Intermittent Supervision/Assistance  Patient can return home with the following  A little help with walking and/or transfers;Assistance with cooking/housework;Assist for transportation;Help with stairs or ramp for entrance    Equipment Recommendations Rolling walker (2 wheels);BSC/3in1  Recommendations for Other Services       Functional Status Assessment Patient has had a recent decline in their functional status and demonstrates the ability to make significant improvements in function in a reasonable and predictable amount of time.     Precautions / Restrictions Precautions Precautions: Fall Restrictions Weight  Bearing Restrictions: Yes RLE Weight Bearing: Weight bearing as tolerated      Mobility  Bed Mobility               General bed mobility comments: required asssitance from RN this morning, no more than husband is able to provide per pt    Transfers Overall transfer level: Needs assistance Equipment used: Rolling walker (2 wheels) Transfers: Sit to/from Stand Sit to Stand: Min guard           General transfer comment: degmented and antalgic, heavy use of BUE    Ambulation/Gait Ambulation/Gait assistance: Min guard Gait Distance (Feet): 50 Feet Assistive device: Rolling walker (2 wheels) Gait Pattern/deviations: Step-to pattern Gait velocity: not assessed     General Gait Details: antalgic, heavy use of BUE  Stairs            Wheelchair Mobility    Modified Rankin (Stroke Patients Only)       Balance                                             Pertinent Vitals/Pain Pain Assessment Pain Assessment: 0-10 Pain Score: 10-Worst pain ever Pain Location: Rt groin/upper adductors Pain Descriptors / Indicators: Aching Pain Intervention(s): Limited activity within patient's tolerance, Monitored during session, Premedicated before session    Home Living Family/patient expects to be discharged to:: Private residence Living Arrangements: Spouse/significant other Available Help at Discharge: Family Type of Home: House Home Access: Stairs to enter Entrance Stairs-Rails: Right Entrance Stairs-Number of Steps: 2   Home Layout: One level Home Equipment: None  Prior Function Prior Level of Function : Independent/Modified Independent             Mobility Comments: community AMB ADLs Comments: independently     Hand Dominance        Extremity/Trunk Assessment                Communication      Cognition Arousal/Alertness: Awake/alert Behavior During Therapy: WFL for tasks assessed/performed Overall Cognitive  Status: Within Functional Limits for tasks assessed                                          General Comments      Exercises     Assessment/Plan    PT Assessment Patient needs continued PT services  PT Problem List Decreased strength;Decreased range of motion;Decreased activity tolerance;Decreased balance;Decreased mobility;Decreased coordination;Decreased knowledge of precautions;Decreased safety awareness       PT Treatment Interventions DME instruction;Gait training;Stair training;Functional mobility training;Therapeutic exercise;Therapeutic activities;Balance training;Patient/family education    PT Goals (Current goals can be found in the Care Plan section)  Acute Rehab PT Goals Patient Stated Goal: DC direct to home, have better pain control PT Goal Formulation: With patient Time For Goal Achievement: 11/20/22 Potential to Achieve Goals: Good    Frequency BID     Co-evaluation               AM-PAC PT "6 Clicks" Mobility  Outcome Measure Help needed turning from your back to your side while in a flat bed without using bedrails?: A Lot Help needed moving from lying on your back to sitting on the side of a flat bed without using bedrails?: A Lot Help needed moving to and from a bed to a chair (including a wheelchair)?: A Little Help needed standing up from a chair using your arms (e.g., wheelchair or bedside chair)?: A Little Help needed to walk in hospital room?: A Little Help needed climbing 3-5 steps with a railing? : A Lot 6 Click Score: 15    End of Session Equipment Utilized During Treatment: Gait belt Activity Tolerance: Patient limited by fatigue;Patient limited by pain Patient left: in chair;with family/visitor present;with call bell/phone within reach;with chair alarm set Nurse Communication: Mobility status PT Visit Diagnosis: Unsteadiness on feet (R26.81);Difficulty in walking, not elsewhere classified (R26.2);Other abnormalities of  gait and mobility (R26.89);History of falling (Z91.81)    Time: 7619-5093 PT Time Calculation (min) (ACUTE ONLY): 25 min   Charges:   PT Evaluation $PT Eval Moderate Complexity: 1 Mod PT Treatments $Gait Training: 8-22 mins       11:49 AM, 11/06/22 Etta Grandchild, PT, DPT Physical Therapist - [facility]  [phone] ([place])    Grayson White C 11/06/2022, 11:47 AM

## 2022-11-06 NOTE — Progress Notes (Signed)
OT Cancellation Note  Patient Details Name: Meagan Cain MRN: [id number] DOB: 1946/06/19   Cancelled Treatment:    Reason Eval/Treat Not Completed: Fatigue/lethargy limiting ability to participate. Pt reports she is too tired and is experiencing too much pain at present to participate in another rehab session this date. Will attempt OT evaluation tomorrow, as pt is medically appropriate and able to engage.  Josiah Lobo 11/06/2022, 4:11 PM

## 2022-11-07 DIAGNOSIS — S72141A Displaced intertrochanteric fracture of right femur, initial encounter for closed fracture: Secondary | ICD-10-CM | POA: Diagnosis not present

## 2022-11-07 LAB — CBC WITH DIFFERENTIAL/PLATELET
Abs Immature Granulocytes: 0.05 10*3/uL (ref 0.00–0.07)
Basophils Absolute: 0 10*3/uL (ref 0.0–0.1)
Basophils Relative: 0 %
Eosinophils Absolute: 0.4 10*3/uL (ref 0.0–0.5)
Eosinophils Relative: 3 %
HCT: 28.6 % — ABNORMAL LOW (ref 36.0–46.0)
Hemoglobin: 9.7 g/dL — ABNORMAL LOW (ref 12.0–15.0)
Immature Granulocytes: 0 %
Lymphocytes Relative: 24 %
Lymphs Abs: 2.6 10*3/uL (ref 0.7–4.0)
MCH: 30.1 pg (ref 26.0–34.0)
MCHC: 33.9 g/dL (ref 30.0–36.0)
MCV: 88.8 fL (ref 80.0–100.0)
Monocytes Absolute: 1 10*3/uL (ref 0.1–1.0)
Monocytes Relative: 9 %
Neutro Abs: 7.1 10*3/uL (ref 1.7–7.7)
Neutrophils Relative %: 64 %
Platelets: 203 10*3/uL (ref 150–400)
RBC: 3.22 MIL/uL — ABNORMAL LOW (ref 3.87–5.11)
RDW: 13.2 % (ref 11.5–15.5)
WBC: 11.2 10*3/uL — ABNORMAL HIGH (ref 4.0–10.5)
nRBC: 0 % (ref 0.0–0.2)

## 2022-11-07 LAB — BASIC METABOLIC PANEL
Anion gap: 6 (ref 5–15)
BUN: 10 mg/dL (ref 8–23)
CO2: 27 mmol/L (ref 22–32)
Calcium: 8.3 mg/dL — ABNORMAL LOW (ref 8.9–10.3)
Chloride: 98 mmol/L (ref 98–111)
Creatinine, Ser: 0.55 mg/dL (ref 0.44–1.00)
GFR, Estimated: >60 mL/min (ref >60)
Glucose, Bld: 116 mg/dL — ABNORMAL HIGH (ref 70–99)
Potassium: 3.8 mmol/L (ref 3.5–5.1)
Sodium: 131 mmol/L — ABNORMAL LOW (ref 135–145)

## 2022-11-07 NOTE — Progress Notes (Signed)
Physical Therapy Treatment Patient Details Name: Meagan Cain MRN: [id number] DOB: 1946-10-01 Today's Date: 11/07/2022   History of Present Illness Meagan Cain is a77yoF who comes to [facility] on 11/04/22 after a fall with subsequent hip pain. PMH: HTN, HLD, hypoTSH. X-ray with mildly displaced and angulated intertrochanteric right hip fracture. Pt underwent Rt femur IMN fixation c Dr Harlow Mares on 11/05/22. Pt is WBAT.    PT Comments    Pt still up to chair since earlier, pain still well controlled at goal, no exacerbation since AM PT session. Pt reports helpful use of ice pack earlier, favorable response. Goal of session is to have pt demonstrate household AMB distances at goal, supervised or assisted by family, pt now preparing for DC to home today after meeting with ortho at bedside. Pt managing transfers well, with RW, heavy use of arms. Pt continues to experience sharp exacerbation of pain levels with attempts of PWB in stance, tolerates TDWB at best. Despite attempts to keep operative limb loading within tolerance, AMB remains very limited by pain, takes 8-10 minutes to cover a very short distance. PT unable to make it to doorway as achieved previous day. This is not a pragmatic ability for safe DC to home given realistic expectations for mobility at DC. Plan set in place is not adequately supportive for safe DC of this patient right now. OT reached out to ortho earlier in day regarding pt's functional limitations and asking about an additional overnight stay to maximize functional ability, caregiver training, and safe mobility techniques for DC to home. I agree with OT assessment, pt would greatly benefit from additional overnight stay, potentially additional DME to meet safe mobility needs should there be minimal improvement.       Recommendations for follow up therapy are one component of a multi-disciplinary discharge planning process, led by the attending physician.  Recommendations may be updated  based on patient status, additional functional criteria and insurance authorization.  Follow Up Recommendations  Home health PT     Assistance Recommended at Discharge Intermittent Supervision/Assistance  Patient can return home with the following A little help with walking and/or transfers;Assistance with cooking/housework;Assist for transportation;Help with stairs or ramp for entrance   Equipment Recommendations  Rolling walker (2 wheels);BSC/3in1    Recommendations for Other Services       Precautions / Restrictions Precautions Precautions: Fall Restrictions Weight Bearing Restrictions: Yes RLE Weight Bearing: Weight bearing as tolerated     Mobility  Bed Mobility               General bed mobility comments: in recliner on arrival    Transfers Overall transfer level: Needs assistance Equipment used: Rolling walker (2 wheels) Transfers: Sit to/from Stand, Bed to chair/wheelchair/BSC Sit to Stand: Supervision   Step pivot transfers: Supervision       General transfer comment: Chief Strategy Officer watches as DTR assists; good control, safe use of RW    Ambulation/Gait Ambulation/Gait assistance: Min guard, Supervision Gait Distance (Feet): 36 Feet Assistive device: Rolling walker (2 wheels) Gait Pattern/deviations: Step-to pattern       General Gait Details: antalgic, heavy use of BUE., slow and segmented due to high pain in weightbearing;AMB ~5-8f segments with 60sec or longer recovery standng breaks before proceeding.   Stairs Stairs:  (unsafe to attempt at this time, defer to next day.)           Wheelchair Mobility    Modified Rankin (Stroke Patients Only)       Balance Overall  balance assessment: Modified Independent, History of Falls             Standing balance comment: heavy reliance on UE, generally steady except for intermittent unexpected spikes in pain.                            Cognition Arousal/Alertness:  Awake/alert Behavior During Therapy: WFL for tasks assessed/performed Overall Cognitive Status: Within Functional Limits for tasks assessed                                          Exercises Total Joint Exercises Short Arc Quad: AAROM, Right, 15 reps, Supine Heel Slides: AAROM, Right, 15 reps, Supine Hip ABduction/ADduction: Right, AAROM, 15 reps, Supine Other Exercises Other Exercises: STS from recliner x3 for 60sec, limited tolerance of loading surgical leg, but can safely balance with toetouch weightrbearing    General Comments        Pertinent Vitals/Pain Pain Assessment Pain Assessment: 0-10 Pain Score: 8  Pain Location: Rt groin/upper adductors Pain Descriptors / Indicators: Aching Pain Intervention(s): Limited activity within patient's tolerance, Monitored during session, Premedicated before session, Repositioned, Ice applied    Home Living                          Prior Function            PT Goals (current goals can now be found in the care plan section) Acute Rehab PT Goals Patient Stated Goal: less pain PT Goal Formulation: With patient Time For Goal Achievement: 11/20/22 Potential to Achieve Goals: Good Progress towards PT goals: Progressing toward goals    Frequency    BID      PT Plan Current plan remains appropriate    Co-evaluation              AM-PAC PT "6 Clicks" Mobility   Outcome Measure  Help needed turning from your back to your side while in a flat bed without using bedrails?: A Little Help needed moving from lying on your back to sitting on the side of a flat bed without using bedrails?: A Little Help needed moving to and from a bed to a chair (including a wheelchair)?: A Little Help needed standing up from a chair using your arms (e.g., wheelchair or bedside chair)?: A Little Help needed to walk in hospital room?: A Little Help needed climbing 3-5 steps with a railing? : A Lot 6 Click Score: 17     End of Session   Activity Tolerance: Patient limited by fatigue;Patient limited by pain Patient left: with call bell/phone within reach;with family/visitor present;in chair Nurse Communication: Mobility status PT Visit Diagnosis: Unsteadiness on feet (R26.81);Difficulty in walking, not elsewhere classified (R26.2);Other abnormalities of gait and mobility (R26.89);History of falling (Z91.81)     Time: 1533-1600 PT Time Calculation (min) (ACUTE ONLY): 27 min  Charges:  $Gait Training: 23-37 mins $Therapeutic Exercise: 23-37 mins                    4:21 PM, 11/07/22 Etta Grandchild, PT, DPT Physical Therapist - [facility]  [phone] ([place])    Pella C 11/07/2022, 4:12 PM

## 2022-11-07 NOTE — Plan of Care (Signed)

## 2022-11-07 NOTE — Progress Notes (Signed)
Progress Note    Meagan Cain  VZD:[id number] DOB: [birth year]  DOA: 11/04/2022 PCP: Idelle Crouch, MD      Brief Narrative:    Medical records reviewed and are as summarized below:  Meagan Cain is a 77 y.o. female with medical history significant for hypertension, hyperlipidemia, hypothyroidism, who presented to the hospital after ground-level fall.  She tripped and landed on her right hip while trying to get something out of a bottom cabinet.        Assessment/Plan:   Principal Problem:   Femur fracture (HCC) Active Problems:   Hypertension   Hyponatremia   Hypothyroidism   Hypokalemia   Body mass index is 27.59 kg/m.   Mildly displaced and angulated intertrochanteric right hip fracture, s/p mechanical fall: S/p right intramedullary nail intertrochanteric on 11/05/2022.  PT and OT recommended additional day in the hospital to continue to work with her gait, balance and entry stairs training.  Continue analgesics as needed for pain.   Chest tightness/discomfort: Resolved.  Troponins negative x 2.  EKG showed normal sinus rhythm, no acute ST-T changes.  Chest tightness could have been due to anxiety since patient said she was feeling anxious.   Constipation: Continue Senokot and MiraLAX.  She has not had a bowel movement.  She wants to continue with MiraLAX and Senokot for now.  She understands that Dulcolax suppository is available as needed.   Hyponatremia: Asymptomatic.     Hypokalemia: Improved.  Continue home dose of potassium chloride 10 mEq daily.     Other comorbidities include hypertension (HCTZ on hold), hypothyroidism   Diet Order             Diet - low sodium heart healthy           Diet regular Room service appropriate? Yes; Fluid consistency: Thin  Diet effective now                            Consultants: Orthopedic surgeon  Procedures: Plan for right hip surgery    Medications:    docusate sodium  100  mg Oral BID   levothyroxine  75 mcg Oral Q0600   losartan  100 mg Oral Daily   potassium chloride  10 mEq Oral Daily   rosuvastatin  5 mg Oral Daily   Continuous Infusions:  clindamycin (CLEOCIN) IV     methocarbamol (ROBAXIN) IV       Anti-infectives (From admission, onward)    Start     Dose/Rate Route Frequency Ordered Stop   11/06/22 0000  ceFAZolin (ANCEF) IVPB 2g/100 mL premix        2 g 200 mL/hr over 30 Minutes Intravenous Every 8 hours 11/05/22 2315 11/06/22 1045   11/05/22 1545  clindamycin (CLEOCIN) IVPB 600 mg        600 mg 100 mL/hr over 30 Minutes Intravenous 30 min pre-op 11/05/22 1548                Family Communication/Anticipated D/C date and plan/Code Status   DVT prophylaxis: SCDs Start: 11/05/22 2315     Code Status: Full Code  Family Communication: Plan discussed with her husband and daughter at the bedside. Disposition Plan: Plan to discharge to home in 1 to 2 days   Status is: Inpatient Remains inpatient appropriate because: S/p right hip surgery       Subjective:   Interval events noted.  She said she  had significant pain in the right hip last night.  She still has pain in the right hip today but not as bad as it was last night.  She had just been given hydrocodone before I came in this morning.  Her husband was at the bedside.  Elysia, case Freight forwarder, was also at the bedside.  Renee, her daughter, also came in during this encounter.  Objective:    Vitals:   11/06/22 1653 11/06/22 2008 11/06/22 2330 11/07/22 0811  BP: (!) 140/78 (!) 152/69 128/72 138/68  Pulse: 68 71 79 81  Resp: '18 18 18 17  '$ Temp: 98.2 F (36.8 C) 98.6 F (37 C) 98.3 F (36.8 C) 99 F (37.2 C)  TempSrc:      SpO2: 97% 100% 94% 96%  Weight:      Height:       No data found.   Intake/Output Summary (Last 24 hours) at 11/07/2022 1606 Last data filed at 11/06/2022 1715 Gross per 24 hour  Intake 250 ml  Output 250 ml  Net 0 ml   Filed Weights    11/04/22 1755  Weight: 66.2 kg    Exam:  GEN: NAD SKIN: Warm and dry EYES: No pallor or icterus ENT: MMM CV: RRR PULM: CTA B ABD: soft, ND, NT, +BS CNS: AAO x 3, non focal EXT: Mild right hip swelling and tenderness.  Dressing on right hip surgical wound is clean, dry and intact      Data Reviewed:   I have personally reviewed following labs and imaging studies:  Labs: Labs show the following:   Basic Metabolic Panel: Recent Labs  Lab 11/04/22 1805 11/05/22 0306 11/06/22 0828 11/07/22 0241  NA 131* 131* 129* 131*  K 3.1* 3.9 3.7 3.8  CL 98 100 96* 98  CO2 '23 26 26 27  '$ GLUCOSE 102* 142* 138* 116*  BUN '12 11 9 10  '$ CREATININE 0.52 0.60 0.52 0.55  CALCIUM 8.6* 8.3* 8.2* 8.3*  MG  --   --  2.0  --    GFR Estimated Creatinine Clearance: 50.5 mL/min (by C-G formula based on SCr of 0.55 mg/dL). Liver Function Tests: Recent Labs  Lab 11/04/22 1805  AST 29  ALT 29  ALKPHOS 62  BILITOT 0.7  PROT 6.7  ALBUMIN 3.7   No results for input(s): "LIPASE", "AMYLASE" in the last 168 hours. No results for input(s): "AMMONIA" in the last 168 hours. Coagulation profile No results for input(s): "INR", "PROTIME" in the last 168 hours.  CBC: Recent Labs  Lab 11/04/22 1805 11/05/22 0306 11/06/22 0828 11/07/22 0241  WBC 9.7 9.5 12.0* 11.2*  NEUTROABS 5.4  --  10.1* 7.1  HGB 11.9* 11.0* 10.9* 9.7*  HCT 35.0* 32.4* 32.3* 28.6*  MCV 88.6 88.0 88.7 88.8  PLT 292 256 228 203   Cardiac Enzymes: No results for input(s): "CKTOTAL", "CKMB", "CKMBINDEX", "TROPONINI" in the last 168 hours. BNP (last 3 results) No results for input(s): "PROBNP" in the last 8760 hours. CBG: Recent Labs  Lab 11/05/22 1537 11/06/22 0810 11/06/22 1122  GLUCAP 88 140* 157*   D-Dimer: No results for input(s): "DDIMER" in the last 72 hours. Hgb A1c: No results for input(s): "HGBA1C" in the last 72 hours. Lipid Profile: No results for input(s): "CHOL", "HDL", "LDLCALC", "TRIG", "CHOLHDL",  "LDLDIRECT" in the last 72 hours. Thyroid function studies: No results for input(s): "TSH", "T4TOTAL", "T3FREE", "THYROIDAB" in the last 72 hours.  Invalid input(s): "FREET3" Anemia work up: No results for input(s): "VITAMINB12", "FOLATE", "  FERRITIN", "TIBC", "IRON", "RETICCTPCT" in the last 72 hours. Sepsis Labs: Recent Labs  Lab 11/04/22 1805 11/05/22 0306 11/06/22 0828 11/07/22 0241  WBC 9.7 9.5 12.0* 11.2*    Microbiology Recent Results (from the past 240 hour(s))  Surgical PCR screen     Status: None   Collection Time: 11/05/22  1:08 AM   Specimen: Nasal Mucosa; Nasal Swab  Result Value Ref Range Status   MRSA, PCR NEGATIVE NEGATIVE Final   Staphylococcus aureus NEGATIVE NEGATIVE Final    Comment: (NOTE) The Xpert SA Assay (FDA approved for NASAL specimens in patients 53 years of age and older), is one component of a comprehensive surveillance program. It is not intended to diagnose infection nor to guide or monitor treatment. Performed at [facility], [address] [place], [place] [address]     Procedures and diagnostic studies:  DG HIP UNILAT WITH PELVIS 2-3 VIEWS RIGHT  Result Date: 11/05/2022 CLINICAL DATA:  Known right hip fracture EXAM: DG HIP (WITH OR WITHOUT PELVIS) 2-3V RIGHT COMPARISON:  11/04/2021 FLUOROSCOPY TIME:  Radiation Exposure Index (as provided by the fluoroscopic device): Not available If the device does not provide the exposure index: Fluoroscopy Time:  27 seconds Number of Acquired Images:  4 FINDINGS: Medullary rod is noted within the right femur. Fixation screw is noted traversing the femoral neck. Fracture fragments are in near anatomic alignment. IMPRESSION: ORIF of proximal right femoral fracture. Electronically Signed   By: Inez Catalina M.D.   On: 11/05/2022 19:57   DG C-Arm 1-60 Min-No Report  Result Date: 11/05/2022 Fluoroscopy was utilized by the requesting physician.  No radiographic interpretation.                LOS: 3 days   Jasper Hanf  [facility]   Pager on [email or web address]. If 7PM-7AM, please contact night-coverage at [email or web address]     11/07/2022, 4:06 PM

## 2022-11-07 NOTE — Progress Notes (Addendum)
Physical Therapy Treatment Patient Details Name: Meagan Cain MRN: [id number] DOB: Aug 07, 1946 Today's Date: 11/07/2022   History of Present Illness Meagan Cain is a77yoF who comes to [facility] on 11/04/22 after a fall with subsequent hip pain. PMH: HTN, HLD, hypoTSH. X-ray with mildly displaced and angulated intertrochanteric right hip fracture. Pt underwent Rt femur IMN fixation c Dr Harlow Mares on 11/05/22. Pt is WBAT.    PT Comments    Pt up to chair on arrival, reports a distressing evening, pain exceeding 10/10, reportedly mobility 5x overnight for using bathroom. At present pain well controlled 4/10. Pt agreeable to progressive session to determine tolerance. Pt does well with assisted efforts, tolerating P/ROM much better than forces required for unassisted full range movement. Pt able to rise to standing 3x with use of arms and RW, but largely avoids putting much weight on leg as an antalgic strategy. Pt left up in chair, encouraged to go through HEP on CL side after lunch. Will attempt in-room AMB later today in 2nd session. Will continue to follow.     Recommendations for follow up therapy are one component of a multi-disciplinary discharge planning process, led by the attending physician.  Recommendations may be updated based on patient status, additional functional criteria and insurance authorization.  Follow Up Recommendations  Home health PT     Assistance Recommended at Discharge Intermittent Supervision/Assistance  Patient can return home with the following A little help with walking and/or transfers;Assistance with cooking/housework;Assist for transportation;Help with stairs or ramp for entrance   Equipment Recommendations  Rolling walker (2 wheels);BSC/3in1    Recommendations for Other Services       Precautions / Restrictions Precautions Precautions: Fall Restrictions Weight Bearing Restrictions: Yes RLE Weight Bearing: Weight bearing as tolerated     Mobility  Bed  Mobility  Pt in recliner at beginning and end of session.                   Transfers  See exercises section below.                       Ambulation/Gait Ambulation/Gait assistance:  (deferred, pt anxious about created another severe pain episode over night again)                 Stairs             Wheelchair Mobility    Modified Rankin (Stroke Patients Only)       Balance                                            Cognition                                                Exercises Total Joint Exercises Short Arc Quad: AAROM, Right, 15 reps, Supine Heel Slides: AAROM, Right, 15 reps, Supine Hip ABduction/ADduction: Right, AAROM, 15 reps, Supine Other Exercises Other Exercises: STS from recliner x3 for 60sec, limited tolerance of loading surgical leg, but can safely balance with toetouch weightrbearing    General Comments        Pertinent Vitals/Pain      Home Living Family/patient expects to be discharged to:: Private residence Living Arrangements: Spouse/significant  other Available Help at Discharge: Family Type of Home: House Home Access: Stairs to enter Entrance Stairs-Rails: Right Entrance Stairs-Number of Steps: 3   Home Layout: One level Home Equipment: None      Prior Function            PT Goals (current goals can now be found in the care plan section) Acute Rehab PT Goals Patient Stated Goal: less pain PT Goal Formulation: With patient Time For Goal Achievement: 11/20/22 Potential to Achieve Goals: Good Progress towards PT goals: Progressing toward goals    Frequency    BID      PT Plan Current plan remains appropriate    Co-evaluation              AM-PAC PT "6 Clicks" Mobility   Outcome Measure  Help needed turning from your back to your side while in a flat bed without using bedrails?: A Little Help needed moving from lying on your back to sitting on  the side of a flat bed without using bedrails?: A Little Help needed moving to and from a bed to a chair (including a wheelchair)?: A Little Help needed standing up from a chair using your arms (e.g., wheelchair or bedside chair)?: A Little Help needed to walk in hospital room?: A Little Help needed climbing 3-5 steps with a railing? : A Lot 6 Click Score: 17    End of Session   Activity Tolerance: No increased pain;Patient tolerated treatment well Patient left: with call bell/phone within reach;with bed alarm set;with family/visitor present;in chair Nurse Communication: Mobility status PT Visit Diagnosis: Unsteadiness on feet (R26.81);Difficulty in walking, not elsewhere classified (R26.2);Other abnormalities of gait and mobility (R26.89);History of falling (Z91.81)     Time: 6837-2902 PT Time Calculation (min) (ACUTE ONLY): 27 min  Charges:  $Therapeutic Exercise: 23-37 mins                    1:48 PM, 11/07/22 Etta Grandchild, PT, DPT Physical Therapist - [facility]  [phone] ([place])    Carleena Mires C 11/07/2022, 1:40 PM

## 2022-11-07 NOTE — Care Management Important Message (Signed)
Important Message  Patient Details  Name: Meagan Cain MRN: [id number] Date of Birth: [birth year]   Medicare Important Message Given:  Yes     Dannette Barbara 11/07/2022, 10:35 AM

## 2022-11-07 NOTE — Evaluation (Signed)
Occupational Therapy Evaluation Patient Details Name: Meagan Cain MRN: [id number] DOB: 07/27/1946 Today's Date: 11/07/2022   History of Present Illness Nevaen Tredway is a77yoF who comes [facility] on 11/04/22 after a fall with subsequent hip pain. PMH: HTN, HLD, hypoTSH. X-ray with mildly displaced and angulated intertrochanteric right hip fracture. Pt underwent Rt femur IMN fixation c Dr Harlow Mares on 11/05/22. Pt is WBAT.   Clinical Impression   Pt seen for OT evaluation this date, POD#2 from above surgery. Pt was independent in all ADLs prior to surgery, ambulating w/o AD. Pt is eager to return to PLOF with less pain and improved safety and independence. Pt currently requires Min-Mod A for bed mobility, sit<>stand transfers, and ambulation. She is highly limited by pain, endorsing 10/10 pain with movement and 6/10 pain with static sitting or in supine. Provided educ re: safe use of RW, self care skills, falls prevention strategies, home/routines modifications, DME/AE for LB bathing and dressing tasks, and car transfer techniques. Pt and husband verbalize understanding, husband able to provide teachback re: assisting pt with transfers, toileting, dressing. Pt would benefit from additional instruction in self care skills and techniques to help support recall and carryover prior to discharge. Recommend HHOT upon discharge.      Recommendations for follow up therapy are one component of a multi-disciplinary discharge planning process, led by the attending physician.  Recommendations may be updated based on patient status, additional functional criteria and insurance authorization.   Follow Up Recommendations  Home health OT     Assistance Recommended at Discharge Frequent or constant Supervision/Assistance  Patient can return home with the following A little help with walking and/or transfers;A little help with bathing/dressing/bathroom;Assistance with cooking/housework;Assist for transportation;Help with  stairs or ramp for entrance    Functional Status Assessment  Patient has had a recent decline in their functional status and demonstrates the ability to make significant improvements in function in a reasonable and predictable amount of time.  Equipment Recommendations  BSC/3in1;Other (comment) (RW)    Recommendations for Other Services       Precautions / Restrictions Precautions Precautions: Fall Restrictions Weight Bearing Restrictions: Yes RLE Weight Bearing: Weight bearing as tolerated      Mobility Bed Mobility Overal bed mobility: Needs Assistance Bed Mobility: Supine to Sit, Sit to Supine     Supine to sit: Mod assist Sit to supine: Mod assist   General bed mobility comments: Mod A for repositioning surgical LE into/out of bed, w/ greatly increased pain w/ all movement    Transfers Overall transfer level: Needs assistance Equipment used: Rolling walker (2 wheels) Transfers: Sit to/from Stand, Bed to chair/wheelchair/BSC Sit to Stand: Min assist     Step pivot transfers: Min assist     General transfer comment: cueing for safe use of RW      Balance Overall balance assessment: Needs assistance Sitting-balance support: Bilateral upper extremity supported, Feet supported Sitting balance-Leahy Scale: Fair     Standing balance support: During functional activity, Reliant on assistive device for balance, Bilateral upper extremity supported Standing balance-Leahy Scale: Fair Standing balance comment: heavy reliance on UE, close SUPV for safety, greatly increased pain with sit<>stand transfers                           ADL either performed or assessed with clinical judgement   ADL  Vision         Perception     Praxis      Pertinent Vitals/Pain Pain Assessment Pain Assessment: 0-10 Pain Score: 10-Worst pain ever Pain Location: Rt groin/upper adductors Pain Descriptors /  Indicators: Aching, Guarding, Grimacing, Crying Pain Intervention(s): Limited activity within patient's tolerance, Monitored during session, Repositioned, Premedicated before session     Hand Dominance     Extremity/Trunk Assessment Upper Extremity Assessment Upper Extremity Assessment: Overall WFL for tasks assessed   Lower Extremity Assessment Lower Extremity Assessment: RLE deficits/detail RLE Deficits / Details: s/p R femur IMN fixation 11/05/2022 RLE: Unable to fully assess due to pain       Communication Communication Communication: No difficulties   Cognition Arousal/Alertness: Awake/alert Behavior During Therapy: WFL for tasks assessed/performed Overall Cognitive Status: Within Functional Limits for tasks assessed                                 General Comments: Pt is A and O x 4     General Comments       Exercises Other Exercises Other Exercises: Educ re: safe use of RW, AE for LB dressing/bathing, home safety and modifications, energy conservation strategies, non-pharmacetical pain mgmt techniques, car transfer techniques   Shoulder Instructions      Home Living Family/patient expects to be discharged to:: Private residence Living Arrangements: Spouse/significant other Available Help at Discharge: Family Type of Home: House Home Access: Stairs to enter Technical brewer of Steps: 3 Entrance Stairs-Rails: Right Home Layout: One level               Home Equipment: None          Prior Functioning/Environment Prior Level of Function : Independent/Modified Independent             Mobility Comments: community AMB ADLs Comments: independent        OT Problem List: Decreased strength;Decreased range of motion;Decreased activity tolerance;Impaired balance (sitting and/or standing);Pain;Decreased knowledge of use of DME or AE      OT Treatment/Interventions: Self-care/ADL training;Therapeutic exercise;Patient/family  education;Balance training;Energy conservation;Therapeutic activities;DME and/or AE instruction    OT Goals(Current goals can be found in the care plan section) Acute Rehab OT Goals Patient Stated Goal: to be in less pain OT Goal Formulation: With patient Time For Goal Achievement: 11/21/22 Potential to Achieve Goals: Good ADL Goals Pt Will Perform Lower Body Dressing: with supervision;sitting/lateral leans Pt Will Transfer to Toilet: with supervision;ambulating;stand pivot transfer Pt Will Perform Tub/Shower Transfer: with supervision;ambulating;rolling walker  OT Frequency: Min 2X/week    Co-evaluation              AM-PAC OT "6 Clicks" Daily Activity     Outcome Measure Help from another person eating meals?: None Help from another person taking care of personal grooming?: A Little Help from another person toileting, which includes using toliet, bedpan, or urinal?: A Lot Help from another person bathing (including washing, rinsing, drying)?: A Lot Help from another person to put on and taking off regular upper body clothing?: A Little Help from another person to put on and taking off regular lower body clothing?: A Lot 6 Click Score: 16   End of Session Equipment Utilized During Treatment: Rolling walker (2 wheels)  Activity Tolerance: Patient limited by pain;Patient tolerated treatment well Patient left: in chair;with call bell/phone within reach;with family/visitor present  OT Visit Diagnosis: Unsteadiness on feet (R26.81);Other abnormalities of gait  and mobility (R26.89);Muscle weakness (generalized) (M62.81);Pain Pain - Right/Left: Right Pain - part of body: Leg;Hip                Time: 2549-8264 OT Time Calculation (min): 36 min Charges:  OT General Charges $OT Visit: 1 Visit OT Evaluation $OT Eval Low Complexity: 1 Low OT Treatments $Self Care/Home Management : 23-37 mins Josiah Lobo, PhD, MS, OTR/L 11/07/22, 10:18 AM

## 2022-11-07 NOTE — TOC Progression Note (Signed)
Transition of Care [facility]) - Progression Note    Patient Details  Name: Meagan Cain MRN: [id number] Date of Birth: [birth year]  Transition of Care [facility]) CM/SW [place], RN Phone Number: 11/07/2022, 11:14 AM  Clinical Narrative:   Met with the patient, her husband and daughter in the room, the patient is tearful and said she wa sin pain last night, very anxious about going home, I explained to her that it would be important to make sure she takes her pain medication as prescribed and not wait until pain is high so that it is controlled as well as possible, The DME is in the room, I explained the [place] will call her once she goes home to arrange when they will be coming for Therapy, she would like to speak to her family and talk with me again later    Expected Discharge Plan: [place] Barriers to Discharge: No Barriers Identified  Expected Discharge Plan and Services   Discharge Planning Services: CM Consult   Living arrangements for the past 2 months: Single Family Home                 DME Arranged: Walker rolling, 3-N-1 DME Agency: [employer] Date DME Agency Contacted: 11/06/22     HH Arranged: PT [place] Agency: [place] Date [place] Agency Contacted: 11/06/22 Time [place]: 1440 Representative spoke with at [place]: [place]   Social Determinants of Health ([place]) Interventions SDOH Screenings   Food Insecurity: No Food Insecurity (11/07/2022)  Housing: Low Risk  (11/07/2022)  Transportation Needs: No Transportation Needs (11/07/2022)  Utilities: Not At Risk (11/07/2022)  Tobacco Use: Low Risk  (11/06/2022)    Readmission Risk Interventions     No data to display

## 2022-11-08 DIAGNOSIS — S72141A Displaced intertrochanteric fracture of right femur, initial encounter for closed fracture: Secondary | ICD-10-CM | POA: Diagnosis not present

## 2022-11-08 NOTE — Progress Notes (Signed)
Physical Therapy Treatment Patient Details Name: Meagan Cain MRN: [id number] DOB: Nov 09, 1945 Today's Date: 11/08/2022   History of Present Illness Meagan Cain is a77yoF who comes to [facility] on 11/04/22 after a fall with subsequent hip pain. PMH: HTN, HLD, hypoTSH. X-ray with mildly displaced and angulated intertrochanteric right hip fracture. Pt underwent Rt femur IMN fixation c Dr Harlow Mares on 11/05/22. Pt is WBAT.    PT Comments    Pt in chair, meds received, pain at goal. Pt demonstrates again supervision level transfers 4x in session. Assistance with pericare, incontinence and costume change in BR with assist from DTR. Repeated stairs training with success. Improved safety, confidence, and competency with mobility tasks. Pt and family attest to confidence and readiness for DC. Pt asks about an especially bloody upper Rt thigh dressing, RN made aware.      Recommendations for follow up therapy are one component of a multi-disciplinary discharge planning process, led by the attending physician.  Recommendations may be updated based on patient status, additional functional criteria and insurance authorization.  Follow Up Recommendations  Home health PT     Assistance Recommended at Discharge Intermittent Supervision/Assistance  Patient can return home with the following A little help with walking and/or transfers;Assistance with cooking/housework;Assist for transportation;Help with stairs or ramp for entrance   Equipment Recommendations  Rolling walker (2 wheels);BSC/3in1    Recommendations for Other Services       Precautions / Restrictions Precautions Precautions: Fall Restrictions RLE Weight Bearing: Weight bearing as tolerated     Mobility  Bed Mobility               General bed mobility comments: in recliner on arrival    Transfers Overall transfer level: Needs assistance Equipment used: Rolling walker (2 wheels) Transfers: Sit to/from Stand Sit to Stand: Modified  independent (Device/Increase time)           General transfer comment: Chief Strategy Officer watches as DTR assists; good control, safe use of RW    Ambulation/Gait Ambulation/Gait assistance: Supervision Gait Distance (Feet): 40 Feet Assistive device: Rolling walker (2 wheels) Gait Pattern/deviations: Step-to pattern       General Gait Details: near continuous effort, author changes footwear mid walk to facilitate problems made worse by high friction footwear that side   Stairs Stairs: Yes Stairs assistance: Min guard Stair Management: No rails, Backwards, With walker Number of Stairs: 2 General stair comments: new technique successfil, family in attendance. video sent to DTR for review   Wheelchair Mobility    Modified Rankin (Stroke Patients Only)       Balance                                            Cognition Arousal/Alertness: Awake/alert Behavior During Therapy: WFL for tasks assessed/performed Overall Cognitive Status: Within Functional Limits for tasks assessed                                 General Comments: Pt is A and O x 4        Exercises General Exercises - Lower Extremity Ankle Circles/Pumps: AROM, 20 reps, Right Heel Slides: AROM, 15 reps, AAROM, Supine, Limitations Heel Slides Limitations: self assist with bedsheet loop Hip ABduction/ADduction: AROM, AAROM, 15 reps, Supine, Limitations Hip Abduction/Adduction Limitations: self assist with bedsheet loop  General Comments        Pertinent Vitals/Pain Pain Assessment Pain Assessment: 0-10 Pain Score: 6  Pain Location: RLE upon arrival Pain Descriptors / Indicators: Aching Pain Intervention(s): Limited activity within patient's tolerance, Monitored during session    Home Living                          Prior Function            PT Goals (current goals can now be found in the care plan section) Acute Rehab PT Goals Patient Stated Goal: less  pain PT Goal Formulation: With patient Time For Goal Achievement: 11/20/22 Potential to Achieve Goals: Good Progress towards PT goals: Progressing toward goals;Goals met/education completed, patient discharged from PT    Frequency    BID      PT Plan Current plan remains appropriate    Co-evaluation              AM-PAC PT "6 Clicks" Mobility   Outcome Measure  Help needed turning from your back to your side while in a flat bed without using bedrails?: A Little Help needed moving from lying on your back to sitting on the side of a flat bed without using bedrails?: A Little Help needed moving to and from a bed to a chair (including a wheelchair)?: A Little Help needed standing up from a chair using your arms (e.g., wheelchair or bedside chair)?: A Little Help needed to walk in hospital room?: A Little Help needed climbing 3-5 steps with a railing? : A Little 6 Click Score: 18    End of Session Equipment Utilized During Treatment: Gait belt Activity Tolerance: Patient tolerated treatment well;No increased pain Patient left: with call bell/phone within reach;with family/visitor present;in chair Nurse Communication: Mobility status PT Visit Diagnosis: Unsteadiness on feet (R26.81);Difficulty in walking, not elsewhere classified (R26.2);Other abnormalities of gait and mobility (R26.89);History of falling (Z91.81)     Time: 3500-9381 PT Time Calculation (min) (ACUTE ONLY): 39 min  Charges:  $Gait Training: 38-52 mins                    3:54 PM, 11/08/22 Etta Grandchild, PT, DPT Physical Therapist - [facility]  [phone] ([place])    [place] C 11/08/2022, 3:51 PM

## 2022-11-08 NOTE — Progress Notes (Incomplete)
Progress Note    Meagan Cain  WIO:[id number] DOB: 1946-04-14  DOA: 11/04/2022 PCP: Idelle Crouch, MD      Brief Narrative:    Medical records reviewed and are as summarized below:  Meagan Cain is a 77 y.o. female with medical history significant for hypertension, hyperlipidemia, hypothyroidism, who presented to the hospital after ground-level fall.  She tripped and landed on her right hip while trying to get something out of a bottom cabinet.        Assessment/Plan:   Principal Problem:   Femur fracture (HCC) Active Problems:   Hypertension   Hyponatremia   Hypothyroidism   Hypokalemia   Body mass index is 27.59 kg/m.   Mildly displaced and angulated intertrochanteric right hip fracture, s/p mechanical fall: S/p right intramedullary nail intertrochanteric on 11/05/2022.  PT and OT recommended additional day in the hospital to continue to work with her gait, balance and entry stairs training.  Continue analgesics as needed for pain.   Chest tightness/discomfort: Resolved.  Troponins negative x 2.  EKG showed normal sinus rhythm, no acute ST-T changes.  Chest tightness could have been due to anxiety since patient said she was feeling anxious.   Constipation: Continue Senokot and MiraLAX.  She has not had a bowel movement.  She wants to continue with MiraLAX and Senokot for now.  She understands that Dulcolax suppository is available as needed.   Hyponatremia: Asymptomatic.     Hypokalemia: Improved.  Continue home dose of potassium chloride 10 mEq daily.     Other comorbidities include hypertension (HCTZ on hold), hypothyroidism   Diet Order             Diet - low sodium heart healthy           Diet regular Room service appropriate? Yes; Fluid consistency: Thin  Diet effective now                            Consultants: Orthopedic surgeon  Procedures: Plan for right hip surgery    Medications:    docusate sodium  100  mg Oral BID   levothyroxine  75 mcg Oral Q0600   losartan  100 mg Oral Daily   potassium chloride  10 mEq Oral Daily   rosuvastatin  5 mg Oral Daily   Continuous Infusions:  clindamycin (CLEOCIN) IV     methocarbamol (ROBAXIN) IV       Anti-infectives (From admission, onward)    Start     Dose/Rate Route Frequency Ordered Stop   11/06/22 0000  ceFAZolin (ANCEF) IVPB 2g/100 mL premix        2 g 200 mL/hr over 30 Minutes Intravenous Every 8 hours 11/05/22 2315 11/06/22 1045   11/05/22 1545  clindamycin (CLEOCIN) IVPB 600 mg        600 mg 100 mL/hr over 30 Minutes Intravenous 30 min pre-op 11/05/22 1548                Family Communication/Anticipated D/C date and plan/Code Status   DVT prophylaxis: SCDs Start: 11/05/22 2315     Code Status: Full Code  Family Communication: Plan discussed with her husband and daughter at the bedside. Disposition Plan: Plan to discharge to home in 1 to 2 days   Status is: Inpatient Remains inpatient appropriate because: S/p right hip surgery       Subjective:   Interval events noted.  She said she  had significant pain in the right hip last night.  She still has pain in the right hip today but not as bad as it was last night.  She had just been given hydrocodone before I came in this morning.  Her husband was at the bedside.  Meagan Cain, case Freight forwarder, was also at the bedside.  Meagan Cain, her daughter, also came in during this encounter.  Objective:    Vitals:   11/07/22 0811 11/07/22 1638 11/08/22 0003 11/08/22 0731  BP: 138/68 (!) 144/89 135/67 (!) 142/68  Pulse: 81 82 79 87  Resp: '17 16 20 17  '$ Temp: 99 F (37.2 C) 98.7 F (37.1 C) 98.2 F (36.8 C) 98.5 F (36.9 C)  TempSrc:      SpO2: 96% 96% 94% 90%  Weight:      Height:       No data found.   Intake/Output Summary (Last 24 hours) at 11/08/2022 1521 Last data filed at 11/08/2022 1500 Gross per 24 hour  Intake 480 ml  Output --  Net 480 ml   Filed Weights   11/04/22  1755  Weight: 66.2 kg    Exam:  GEN: NAD SKIN: Warm and dry EYES: No pallor or icterus ENT: MMM CV: RRR PULM: CTA B ABD: soft, ND, NT, +BS CNS: AAO x 3, non focal EXT: Mild right hip swelling and tenderness.  Dressing on right hip surgical wound is clean, dry and intact      Data Reviewed:   I have personally reviewed following labs and imaging studies:  Labs: Labs show the following:   Basic Metabolic Panel: Recent Labs  Lab 11/04/22 1805 11/05/22 0306 11/06/22 0828 11/07/22 0241  NA 131* 131* 129* 131*  K 3.1* 3.9 3.7 3.8  CL 98 100 96* 98  CO2 '23 26 26 27  '$ GLUCOSE 102* 142* 138* 116*  BUN '12 11 9 10  '$ CREATININE 0.52 0.60 0.52 0.55  CALCIUM 8.6* 8.3* 8.2* 8.3*  MG  --   --  2.0  --    GFR Estimated Creatinine Clearance: 50.5 mL/min (by C-G formula based on SCr of 0.55 mg/dL). Liver Function Tests: Recent Labs  Lab 11/04/22 1805  AST 29  ALT 29  ALKPHOS 62  BILITOT 0.7  PROT 6.7  ALBUMIN 3.7   No results for input(s): "LIPASE", "AMYLASE" in the last 168 hours. No results for input(s): "AMMONIA" in the last 168 hours. Coagulation profile No results for input(s): "INR", "PROTIME" in the last 168 hours.  CBC: Recent Labs  Lab 11/04/22 1805 11/05/22 0306 11/06/22 0828 11/07/22 0241  WBC 9.7 9.5 12.0* 11.2*  NEUTROABS 5.4  --  10.1* 7.1  HGB 11.9* 11.0* 10.9* 9.7*  HCT 35.0* 32.4* 32.3* 28.6*  MCV 88.6 88.0 88.7 88.8  PLT 292 256 228 203   Cardiac Enzymes: No results for input(s): "CKTOTAL", "CKMB", "CKMBINDEX", "TROPONINI" in the last 168 hours. BNP (last 3 results) No results for input(s): "PROBNP" in the last 8760 hours. CBG: Recent Labs  Lab 11/05/22 1537 11/06/22 0810 11/06/22 1122  GLUCAP 88 140* 157*   D-Dimer: No results for input(s): "DDIMER" in the last 72 hours. Hgb A1c: No results for input(s): "HGBA1C" in the last 72 hours. Lipid Profile: No results for input(s): "CHOL", "HDL", "LDLCALC", "TRIG", "CHOLHDL",  "LDLDIRECT" in the last 72 hours. Thyroid function studies: No results for input(s): "TSH", "T4TOTAL", "T3FREE", "THYROIDAB" in the last 72 hours.  Invalid input(s): "FREET3" Anemia work up: No results for input(s): "VITAMINB12", "FOLATE", "FERRITIN", "  TIBC", "IRON", "RETICCTPCT" in the last 72 hours. Sepsis Labs: Recent Labs  Lab 11/04/22 1805 11/05/22 0306 11/06/22 0828 11/07/22 0241  WBC 9.7 9.5 12.0* 11.2*    Microbiology Recent Results (from the past 240 hour(s))  Surgical PCR screen     Status: None   Collection Time: 11/05/22  1:08 AM   Specimen: Nasal Mucosa; Nasal Swab  Result Value Ref Range Status   MRSA, PCR NEGATIVE NEGATIVE Final   Staphylococcus aureus NEGATIVE NEGATIVE Final    Comment: (NOTE) The Xpert SA Assay (FDA approved for NASAL specimens in patients 51 years of age and older), is one component of a comprehensive surveillance program. It is not intended to diagnose infection nor to guide or monitor treatment. Performed at [facility], [address] [place], [place] [address]     Procedures and diagnostic studies:  No results found.             LOS: 4 days   Michall Noffke  [facility]   Pager on [email or web address]. If 7PM-7AM, please contact night-coverage at [email or web address]     11/08/2022, 3:21 PM

## 2022-11-08 NOTE — Progress Notes (Signed)
Right hip dressing changed per patient request. Discharged home, all belongings with patient and family.

## 2022-11-08 NOTE — Plan of Care (Signed)

## 2022-11-08 NOTE — Progress Notes (Signed)
Physical Therapy Treatment Patient Details Name: Meagan Cain MRN: [id number] DOB: May 11, 1946 Today's Date: 11/08/2022   History of Present Illness Meagan Cain is a77yoF who comes to [facility] on 11/04/22 after a fall with subsequent hip pain. PMH: HTN, HLD, hypoTSH. X-ray with mildly displaced and angulated intertrochanteric right hip fracture. Pt underwent Rt femur IMN fixation c Dr Harlow Mares on 11/05/22. Pt is WBAT.    PT Comments    PT in chair, reports better pain control overnight. She is up to chair already, agreeable to session. Pt advances AMB tolerance to 55f today easily couldve gone farther but author doses activity appropriately to optimize stairs training. Pt performs several safe, proficient STS transfers in session. Pt is shown posterior technique for stairs completion to remove active hip flexion used in forward approach. RW is too high in current position which limited ability to success, YRW used as a stand in to complete training. Will modify task later and ascertain correct DME. Pt left up in chair at end of session. DTR is present for all education interventions. Plan to repeat stairs education in afternoon in anticipation of DC to home thereafter.   Recommendations for follow up therapy are one component of a multi-disciplinary discharge planning process, led by the attending physician.  Recommendations may be updated based on patient status, additional functional criteria and insurance authorization.  Follow Up Recommendations  Home health PT     Assistance Recommended at Discharge Intermittent Supervision/Assistance  Patient can return home with the following A little help with walking and/or transfers;Assistance with cooking/housework;Assist for transportation;Help with stairs or ramp for entrance   Equipment Recommendations  Rolling walker (2 wheels);BSC/3in1    Recommendations for Other Services       Precautions / Restrictions Precautions Precautions:  Fall Restrictions Weight Bearing Restrictions: Yes RLE Weight Bearing: Weight bearing as tolerated     Mobility  Bed Mobility               General bed mobility comments: in recliner on arrival    Transfers Overall transfer level: Needs assistance Equipment used: Rolling walker (2 wheels) Transfers: Sit to/from Stand Sit to Stand: Supervision           General transfer comment: AChief Strategy Officerwatches as DTR assists; good control, safe use of RW    Ambulation/Gait Ambulation/Gait assistance: Supervision Gait Distance (Feet): 50 Feet Assistive device: Rolling walker (2 wheels) Gait Pattern/deviations: Step-to pattern       General Gait Details: near continuous effort, author changes footwear mid walk to facilitate problems made worse by high friction footwear that side (confidence improved)   Stairs Stairs: Yes     Number of Stairs: 4 General stair comments: 2 singles, 1 double; takes 30sec or more revoery between steps for recovery; RW height is prohibitive with initital technique due to elbow flexion, able to accomplish task with YRW; will modify in afternoond session.   Wheelchair Mobility    Modified Rankin (Stroke Patients Only)       Balance                                            Cognition  Exercises General Exercises - Lower Extremity Ankle Circles/Pumps: AROM, 20 reps, Right Heel Slides: AROM, 15 reps, AAROM, Supine, Limitations Heel Slides Limitations: self assist with bedsheet loop Hip ABduction/ADduction: AROM, AAROM, 15 reps, Supine, Limitations Hip Abduction/Adduction Limitations: self assist with bedsheet loop    General Comments        Pertinent Vitals/Pain Pain Assessment Pain Assessment: 0-10 Pain Score: 6  Pain Location: RLE upon arrival Pain Descriptors / Indicators: Aching Pain Intervention(s): Limited activity within patient's tolerance,  Monitored during session    Home Living                          Prior Function            PT Goals (current goals can now be found in the care plan section) Acute Rehab PT Goals Patient Stated Goal: less pain PT Goal Formulation: With patient Time For Goal Achievement: 11/20/22 Potential to Achieve Goals: Good Progress towards PT goals: Progressing toward goals    Frequency    BID      PT Plan Current plan remains appropriate    Co-evaluation              AM-PAC PT "6 Clicks" Mobility   Outcome Measure  Help needed turning from your back to your side while in a flat bed without using bedrails?: A Little Help needed moving from lying on your back to sitting on the side of a flat bed without using bedrails?: A Little Help needed moving to and from a bed to a chair (including a wheelchair)?: A Little Help needed standing up from a chair using your arms (e.g., wheelchair or bedside chair)?: A Little Help needed to walk in hospital room?: A Little Help needed climbing 3-5 steps with a railing? : A Little 6 Click Score: 18    End of Session Equipment Utilized During Treatment: Gait belt Activity Tolerance: Patient limited by fatigue;Patient limited by pain Patient left: with call bell/phone within reach;with family/visitor present;in chair Nurse Communication: Mobility status PT Visit Diagnosis: Unsteadiness on feet (R26.81);Difficulty in walking, not elsewhere classified (R26.2);Other abnormalities of gait and mobility (R26.89);History of falling (Z91.81)     Time: 1025-1106 PT Time Calculation (min) (ACUTE ONLY): 41 min  Charges:  $Gait Training: 23-37 mins $Therapeutic Exercise: 8-22 mins                    11:18 AM, 11/08/22 Etta Grandchild, PT, DPT Physical Therapist - [facility]  [phone] ([place])    [place] C 11/08/2022, 11:15 AM

## 2022-11-08 NOTE — Discharge Summary (Signed)
Physician Discharge Summary   Patient: Meagan Cain MRN: [id number] DOB: [birth year]  Admit date:     11/04/2022  Discharge date: 11/08/22  Discharge Physician: Jennye Boroughs   PCP: Idelle Crouch, MD   Recommendations at discharge:  {Tip this will not be part of the note when signed- Example include specific recommendations for outpatient follow-up, pending tests to follow-up on. (Optional):26781} Follow-up with PCP in 1 to 2 weeks Follow-up with orthopedic surgeon in 2 weeks  Discharge Diagnoses: Principal Problem:   Femur fracture ([place]) Active Problems:   Hypertension   Hyponatremia   Hypothyroidism   Hypokalemia  Resolved Problems:   * No resolved hospital problems. [facility] Course:  Meagan Cain is a 77 y.o. female with medical history significant for hypertension, hyperlipidemia, hypothyroidism, who presented to the hospital after ground-level fall.  She tripped and landed on her right hip while trying to get something out of a bottom cabinet.   Workup revealed right hip fracture.  She was treated with analgesics.  She underwent right intramedullary nail intertrochanteric on 11/05/2022.  She had chest tightness/discomfort that was attributed to anxiety.  Troponins were negative and EKG was unremarkable.  She had hypokalemia that was repleted.  She also has hyponatremia which is asymptomatic.  She was evaluated by PT and OT who recommended home health therapy.  Her condition has improved and she is deemed stable for discharge to home today.    {Tip this will not be part of the note when signed Body mass index is 27.59 kg/m. , ,  (Optional):26781}   Consultants: Orthopedic surgeon Procedures performed: Right intramedullary nail intertrochanteric Disposition: Home health Diet recommendation:  Discharge Diet Orders (From admission, onward)     Start     Ordered   11/07/22 0000  Diet - low sodium heart healthy        11/07/22 1423           Cardiac  diet DISCHARGE MEDICATION: Allergies as of 11/08/2022       Reactions   Ceftin [cefuroxime Axetil]    Cephalosporins Nausea And Vomiting   Ibandronic Acid Nausea And Vomiting   Sulfa Antibiotics Rash        Medication List     STOP taking these medications    hydrALAZINE 25 MG tablet Commonly known as: APRESOLINE   hydrochlorothiazide 12.5 MG tablet Commonly known as: HYDRODIURIL       TAKE these medications    aspirin EC 81 MG tablet Take 1 tablet (81 mg total) by mouth 2 (two) times daily. What changed: when to take this   conjugated estrogens 0.625 MG/GM vaginal cream Commonly known as: PREMARIN Place 1 Applicatorful vaginally 2 (two) times a week. 1 gram two times a week   docusate sodium 100 MG capsule Commonly known as: COLACE Take 1 capsule (100 mg total) by mouth 2 (two) times daily.   HYDROcodone-acetaminophen 5-325 MG tablet Commonly known as: NORCO/VICODIN Take 1 tablet by mouth every 6 (six) hours as needed for moderate pain or severe pain.   levothyroxine 75 MCG tablet Commonly known as: SYNTHROID Take 75 mcg by mouth daily before breakfast.   losartan 100 MG tablet Commonly known as: COZAAR Take 100 mg by mouth daily.   methocarbamol 500 MG tablet Commonly known as: ROBAXIN Take 1 tablet (500 mg total) by mouth every 8 (eight) hours as needed for muscle spasms.   potassium chloride 10 MEQ tablet Commonly known as: KLOR-CON Take 10 mEq  by mouth daily.   RA Vitamin B-12 TR 1000 MCG Tbcr Generic drug: Cyanocobalamin Take 1,000 mcg by mouth daily.   rosuvastatin 5 MG tablet Commonly known as: CRESTOR Take 5 mg by mouth daily.   Vitamin D3 50 MCG (2000 UT) capsule Take 1,000 Units by mouth daily.               Durable Medical Equipment  (From admission, onward)           Start     Ordered   11/06/22 1443  For home use only DME Bedside commode  Once       Question:  Patient needs a bedside commode to treat with the  following condition  Answer:  Impaired mobility   11/06/22 1442   11/06/22 1442  For home use only DME Walker rolling  Once       Question Answer Comment  Walker: With [place] Wheels   Patient needs a walker to treat with the following condition Impaired mobility      11/06/22 1442            Follow-up Information     Carlynn Spry, PA-C. Schedule an appointment as soon as possible for a visit in 2 week(s).   Specialty: Orthopedic Surgery Contact information: [place] [address] [phone]                Discharge Exam: Danley Danker Weights   11/04/22 1755  Weight: 66.2 kg   GEN: NAD SKIN: Warm and dry EYES: No pallor orr icterus ENT: MMM CV: RRR PULM: CTA B ABD: soft, ND, NT, +BS CNS: AAO x 3, non focal EXT: Mild right hip swelling and surgical tenderness.  Dressing on right hip surgical wound is stained with some blood.  No active bleeding from surgical site.   Condition at discharge: good  The results of significant diagnostics from this hospitalization (including imaging, microbiology, ancillary and laboratory) are listed below for reference.   Imaging Studies: DG HIP UNILAT WITH PELVIS 2-3 VIEWS RIGHT  Result Date: 11/05/2022 CLINICAL DATA:  Known right hip fracture EXAM: DG HIP (WITH OR WITHOUT PELVIS) 2-3V RIGHT COMPARISON:  11/04/2021 FLUOROSCOPY TIME:  Radiation Exposure Index (as provided by the fluoroscopic device): Not available If the device does not provide the exposure index: Fluoroscopy Time:  27 seconds Number of Acquired Images:  4 FINDINGS: Medullary rod is noted within the right femur. Fixation screw is noted traversing the femoral neck. Fracture fragments are in near anatomic alignment. IMPRESSION: ORIF of proximal right femoral fracture. Electronically Signed   By: Inez Catalina M.D.   On: 11/05/2022 19:57   DG C-Arm 1-60 Min-No Report  Result Date: 11/05/2022 Fluoroscopy was utilized by the requesting physician.  No  radiographic interpretation.   DG Chest 1 View  Result Date: 11/04/2022 CLINICAL DATA:  Pain after fall EXAM: CHEST  1 VIEW COMPARISON:  None Available. FINDINGS: No consolidation, pneumothorax or effusion. Enlarged cardiopericardial silhouette with tortuous aorta. No edema. Surgical clips in the right upper quadrant of the abdomen. Curvature and degenerative changes along the spine. IMPRESSION: No acute cardiopulmonary disease.  Enlarged heart Electronically Signed   By: Jill Side M.D.   On: 11/04/2022 18:42   DG Hip Unilat W or Wo Pelvis 2-3 Views Right  Result Date: 11/04/2022 CLINICAL DATA:  Pain after trauma EXAM: DG HIP (WITH OR WITHOUT PELVIS) 3V RIGHT COMPARISON:  None Available. FINDINGS: Mildly displaced intertrochanteric right hip fracture identified with  some angulation. Osteopenia. No additional fracture or dislocation. Mild joint space loss of the right hip. Note is made of significant colonic stool in the midabdomen. There are some well rounded densities in the pelvis which are indeterminate although possibly vascular. IMPRESSION: Mildly displaced and angulated intertrochanteric right hip fracture. Osteopenia. Degenerative change Electronically Signed   By: Jill Side M.D.   On: 11/04/2022 18:41   CT HEAD WO CONTRAST (5MM)  Result Date: 11/04/2022 CLINICAL DATA:  Fall EXAM: CT HEAD WITHOUT CONTRAST CT CERVICAL SPINE WITHOUT CONTRAST TECHNIQUE: Multidetector CT imaging of the head and cervical spine was performed following the standard protocol without intravenous contrast. Multiplanar CT image reconstructions of the cervical spine were also generated. RADIATION DOSE REDUCTION: This exam was performed according to the departmental dose-optimization program which includes automated exposure control, adjustment of the mA and/or kV according to patient size and/or use of iterative reconstruction technique. COMPARISON:  None Available. FINDINGS: CT HEAD FINDINGS Brain: No evidence of acute  infarct, hemorrhage, mass, mass effect, or midline shift. No hydrocephalus or extra-axial fluid collection. Periventricular white matter changes, likely the sequela of chronic small vessel ischemic disease. Remote lacunar infarcts in the bilateral basal ganglia. Vascular: No hyperdense vessel. Skull: Normal. Negative for fracture or focal lesion. Sinuses/Orbits: No acute finding. Other: The mastoid air cells are well aerated. CT CERVICAL SPINE FINDINGS Alignment: Trace retrolisthesis of C3 on C4 and trace anterolisthesis of C5 on C6, which appears facet mediated. Skull base and vertebrae: No acute fracture. No primary bone lesion or focal pathologic process. Partial osseous fusion across the left facets at C4 and C5. Soft tissues and spinal canal: No prevertebral fluid or swelling. No visible canal hematoma. Disc levels: Multilevel disc height loss. No high-grade spinal canal stenosis. Facet and uncovertebral hypertrophy. Upper chest: Negative. Other: None. IMPRESSION: 1. No acute intracranial process. 2. No acute fracture or traumatic listhesis in the cervical spine. Electronically Signed   By: Merilyn Baba M.D.   On: 11/04/2022 18:34   CT Cervical Spine Wo Contrast  Result Date: 11/04/2022 CLINICAL DATA:  Fall EXAM: CT HEAD WITHOUT CONTRAST CT CERVICAL SPINE WITHOUT CONTRAST TECHNIQUE: Multidetector CT imaging of the head and cervical spine was performed following the standard protocol without intravenous contrast. Multiplanar CT image reconstructions of the cervical spine were also generated. RADIATION DOSE REDUCTION: This exam was performed according to the departmental dose-optimization program which includes automated exposure control, adjustment of the mA and/or kV according to patient size and/or use of iterative reconstruction technique. COMPARISON:  None Available. FINDINGS: CT HEAD FINDINGS Brain: No evidence of acute infarct, hemorrhage, mass, mass effect, or midline shift. No hydrocephalus or  extra-axial fluid collection. Periventricular white matter changes, likely the sequela of chronic small vessel ischemic disease. Remote lacunar infarcts in the bilateral basal ganglia. Vascular: No hyperdense vessel. Skull: Normal. Negative for fracture or focal lesion. Sinuses/Orbits: No acute finding. Other: The mastoid air cells are well aerated. CT CERVICAL SPINE FINDINGS Alignment: Trace retrolisthesis of C3 on C4 and trace anterolisthesis of C5 on C6, which appears facet mediated. Skull base and vertebrae: No acute fracture. No primary bone lesion or focal pathologic process. Partial osseous fusion across the left facets at C4 and C5. Soft tissues and spinal canal: No prevertebral fluid or swelling. No visible canal hematoma. Disc levels: Multilevel disc height loss. No high-grade spinal canal stenosis. Facet and uncovertebral hypertrophy. Upper chest: Negative. Other: None. IMPRESSION: 1. No acute intracranial process. 2. No acute fracture or traumatic listhesis in  the cervical spine. Electronically Signed   By: Merilyn Baba M.D.   On: 11/04/2022 18:34    Microbiology: Results for orders placed or performed during the hospital encounter of 11/04/22  Surgical PCR screen     Status: None   Collection Time: 11/05/22  1:08 AM   Specimen: Nasal Mucosa; Nasal Swab  Result Value Ref Range Status   MRSA, PCR NEGATIVE NEGATIVE Final   Staphylococcus aureus NEGATIVE NEGATIVE Final    Comment: (NOTE) The Xpert SA Assay (FDA approved for NASAL specimens in patients 94 years of age and older), is one component of a comprehensive surveillance program. It is not intended to diagnose infection nor to guide or monitor treatment. Performed at [facility], [address] [place], [place] [address]     Labs: CBC: Recent Labs  Lab 11/04/22 1805 11/05/22 0306 11/06/22 0828 11/07/22 0241  WBC 9.7 9.5 12.0* 11.2*  NEUTROABS 5.4  --  10.1* 7.1  HGB 11.9* 11.0* 10.9* 9.7*  HCT 35.0* 32.4*  32.3* 28.6*  MCV 88.6 88.0 88.7 88.8  PLT 292 256 228 553   Basic Metabolic Panel: Recent Labs  Lab 11/04/22 1805 11/05/22 0306 11/06/22 0828 11/07/22 0241  NA 131* 131* 129* 131*  K 3.1* 3.9 3.7 3.8  CL 98 100 96* 98  CO2 '23 26 26 27  '$ GLUCOSE 102* 142* 138* 116*  BUN '12 11 9 10  '$ CREATININE 0.52 0.60 0.52 0.55  CALCIUM 8.6* 8.3* 8.2* 8.3*  MG  --   --  2.0  --    Liver Function Tests: Recent Labs  Lab 11/04/22 1805  AST 29  ALT 29  ALKPHOS 62  BILITOT 0.7  PROT 6.7  ALBUMIN 3.7   CBG: Recent Labs  Lab 11/05/22 1537 11/06/22 0810 11/06/22 1122  GLUCAP 88 140* 157*    Discharge time spent: greater than 30 minutes.  Signed: Jennye Boroughs, MD [facility] 11/08/2022

## 2023-04-02 ENCOUNTER — Other Ambulatory Visit: Payer: Self-pay | Admitting: Internal Medicine

## 2023-04-02 DIAGNOSIS — Z1231 Encounter for screening mammogram for malignant neoplasm of breast: Secondary | ICD-10-CM

## 2023-05-06 ENCOUNTER — Ambulatory Visit
Admission: RE | Admit: 2023-05-06 | Discharge: 2023-05-06 | Disposition: A | Discharge status: Home / Self Care | Payer: Medicare HMO | Source: Ambulatory Visit | Attending: Internal Medicine | Admitting: Internal Medicine

## 2023-05-06 DIAGNOSIS — Z1231 Encounter for screening mammogram for malignant neoplasm of breast: Secondary | ICD-10-CM | POA: Insufficient documentation

## 2023-08-20 ENCOUNTER — Other Ambulatory Visit: Payer: Self-pay | Admitting: Internal Medicine

## 2023-08-20 DIAGNOSIS — E782 Mixed hyperlipidemia: Secondary | ICD-10-CM

## 2023-08-20 DIAGNOSIS — R609 Edema, unspecified: Secondary | ICD-10-CM

## 2023-08-31 ENCOUNTER — Other Ambulatory Visit: Payer: Medicare HMO

## 2023-09-08 ENCOUNTER — Ambulatory Visit
Admission: RE | Admit: 2023-09-08 | Discharge: 2023-09-08 | Disposition: A | Discharge status: Home / Self Care | Payer: Medicare HMO | Source: Ambulatory Visit | Attending: Internal Medicine | Admitting: Internal Medicine

## 2023-09-08 DIAGNOSIS — R609 Edema, unspecified: Secondary | ICD-10-CM | POA: Insufficient documentation

## 2023-09-08 DIAGNOSIS — E782 Mixed hyperlipidemia: Secondary | ICD-10-CM | POA: Insufficient documentation

## 2023-10-13 DIAGNOSIS — I10 Essential (primary) hypertension: Secondary | ICD-10-CM | POA: Diagnosis not present

## 2023-10-13 DIAGNOSIS — E079 Disorder of thyroid, unspecified: Secondary | ICD-10-CM | POA: Diagnosis not present

## 2023-10-13 DIAGNOSIS — E782 Mixed hyperlipidemia: Secondary | ICD-10-CM | POA: Diagnosis not present

## 2023-10-13 DIAGNOSIS — G720 Drug-induced myopathy: Secondary | ICD-10-CM | POA: Diagnosis not present

## 2023-10-13 DIAGNOSIS — F321 Major depressive disorder, single episode, moderate: Secondary | ICD-10-CM | POA: Diagnosis not present

## 2023-10-13 DIAGNOSIS — Z79899 Other long term (current) drug therapy: Secondary | ICD-10-CM | POA: Diagnosis not present

## 2023-10-13 DIAGNOSIS — T466X5A Adverse effect of antihyperlipidemic and antiarteriosclerotic drugs, initial encounter: Secondary | ICD-10-CM | POA: Diagnosis not present

## 2023-10-14 DIAGNOSIS — F321 Major depressive disorder, single episode, moderate: Secondary | ICD-10-CM | POA: Diagnosis not present

## 2023-10-14 DIAGNOSIS — G720 Drug-induced myopathy: Secondary | ICD-10-CM | POA: Diagnosis not present

## 2023-10-14 DIAGNOSIS — E079 Disorder of thyroid, unspecified: Secondary | ICD-10-CM | POA: Diagnosis not present

## 2023-10-14 DIAGNOSIS — Z79899 Other long term (current) drug therapy: Secondary | ICD-10-CM | POA: Diagnosis not present

## 2023-10-14 DIAGNOSIS — T466X5A Adverse effect of antihyperlipidemic and antiarteriosclerotic drugs, initial encounter: Secondary | ICD-10-CM | POA: Diagnosis not present

## 2023-10-14 DIAGNOSIS — E782 Mixed hyperlipidemia: Secondary | ICD-10-CM | POA: Diagnosis not present

## 2023-10-14 DIAGNOSIS — I10 Essential (primary) hypertension: Secondary | ICD-10-CM | POA: Diagnosis not present

## 2023-11-25 DIAGNOSIS — E079 Disorder of thyroid, unspecified: Secondary | ICD-10-CM | POA: Diagnosis not present

## 2023-11-25 DIAGNOSIS — F320 Major depressive disorder, single episode, mild: Secondary | ICD-10-CM | POA: Diagnosis not present

## 2023-11-25 DIAGNOSIS — K219 Gastro-esophageal reflux disease without esophagitis: Secondary | ICD-10-CM | POA: Diagnosis not present

## 2023-11-25 DIAGNOSIS — G720 Drug-induced myopathy: Secondary | ICD-10-CM | POA: Diagnosis not present

## 2023-11-25 DIAGNOSIS — E782 Mixed hyperlipidemia: Secondary | ICD-10-CM | POA: Diagnosis not present

## 2023-11-25 DIAGNOSIS — E876 Hypokalemia: Secondary | ICD-10-CM | POA: Diagnosis not present

## 2023-11-25 DIAGNOSIS — M810 Age-related osteoporosis without current pathological fracture: Secondary | ICD-10-CM | POA: Diagnosis not present

## 2023-11-25 DIAGNOSIS — I10 Essential (primary) hypertension: Secondary | ICD-10-CM | POA: Diagnosis not present

## 2023-11-25 DIAGNOSIS — Z0000 Encounter for general adult medical examination without abnormal findings: Secondary | ICD-10-CM | POA: Diagnosis not present

## 2023-11-25 DIAGNOSIS — T466X5A Adverse effect of antihyperlipidemic and antiarteriosclerotic drugs, initial encounter: Secondary | ICD-10-CM | POA: Diagnosis not present

## 2023-11-25 DIAGNOSIS — N952 Postmenopausal atrophic vaginitis: Secondary | ICD-10-CM | POA: Diagnosis not present

## 2023-12-07 DIAGNOSIS — S72001A Fracture of unspecified part of neck of right femur, initial encounter for closed fracture: Secondary | ICD-10-CM | POA: Diagnosis not present

## 2023-12-07 DIAGNOSIS — M25551 Pain in right hip: Secondary | ICD-10-CM | POA: Diagnosis not present

## 2023-12-23 DIAGNOSIS — M25551 Pain in right hip: Secondary | ICD-10-CM | POA: Diagnosis not present

## 2023-12-23 DIAGNOSIS — M25651 Stiffness of right hip, not elsewhere classified: Secondary | ICD-10-CM | POA: Diagnosis not present

## 2024-01-06 DIAGNOSIS — M25651 Stiffness of right hip, not elsewhere classified: Secondary | ICD-10-CM | POA: Diagnosis not present

## 2024-01-06 DIAGNOSIS — M25551 Pain in right hip: Secondary | ICD-10-CM | POA: Diagnosis not present

## 2024-01-13 DIAGNOSIS — M25551 Pain in right hip: Secondary | ICD-10-CM | POA: Diagnosis not present

## 2024-01-13 DIAGNOSIS — M25651 Stiffness of right hip, not elsewhere classified: Secondary | ICD-10-CM | POA: Diagnosis not present

## 2024-02-03 DIAGNOSIS — M25651 Stiffness of right hip, not elsewhere classified: Secondary | ICD-10-CM | POA: Diagnosis not present

## 2024-02-03 DIAGNOSIS — M25551 Pain in right hip: Secondary | ICD-10-CM | POA: Diagnosis not present

## 2024-02-10 DIAGNOSIS — M25651 Stiffness of right hip, not elsewhere classified: Secondary | ICD-10-CM | POA: Diagnosis not present

## 2024-02-10 DIAGNOSIS — M25551 Pain in right hip: Secondary | ICD-10-CM | POA: Diagnosis not present

## 2024-02-12 DIAGNOSIS — S72001A Fracture of unspecified part of neck of right femur, initial encounter for closed fracture: Secondary | ICD-10-CM | POA: Diagnosis not present

## 2024-02-23 DIAGNOSIS — R5383 Other fatigue: Secondary | ICD-10-CM | POA: Diagnosis not present

## 2024-02-23 DIAGNOSIS — M79651 Pain in right thigh: Secondary | ICD-10-CM | POA: Diagnosis not present

## 2024-02-23 DIAGNOSIS — Z1231 Encounter for screening mammogram for malignant neoplasm of breast: Secondary | ICD-10-CM | POA: Diagnosis not present

## 2024-02-23 DIAGNOSIS — R5381 Other malaise: Secondary | ICD-10-CM | POA: Diagnosis not present

## 2024-02-23 DIAGNOSIS — M810 Age-related osteoporosis without current pathological fracture: Secondary | ICD-10-CM | POA: Diagnosis not present

## 2024-02-23 DIAGNOSIS — Z79899 Other long term (current) drug therapy: Secondary | ICD-10-CM | POA: Diagnosis not present

## 2024-02-23 DIAGNOSIS — E782 Mixed hyperlipidemia: Secondary | ICD-10-CM | POA: Diagnosis not present

## 2024-02-23 DIAGNOSIS — F321 Major depressive disorder, single episode, moderate: Secondary | ICD-10-CM | POA: Diagnosis not present

## 2024-02-23 DIAGNOSIS — I10 Essential (primary) hypertension: Secondary | ICD-10-CM | POA: Diagnosis not present

## 2024-02-23 DIAGNOSIS — E079 Disorder of thyroid, unspecified: Secondary | ICD-10-CM | POA: Diagnosis not present

## 2024-02-23 DIAGNOSIS — G720 Drug-induced myopathy: Secondary | ICD-10-CM | POA: Diagnosis not present

## 2024-02-23 DIAGNOSIS — T466X5A Adverse effect of antihyperlipidemic and antiarteriosclerotic drugs, initial encounter: Secondary | ICD-10-CM | POA: Diagnosis not present

## 2024-02-23 DIAGNOSIS — K219 Gastro-esophageal reflux disease without esophagitis: Secondary | ICD-10-CM | POA: Diagnosis not present

## 2024-02-24 ENCOUNTER — Other Ambulatory Visit: Payer: Self-pay | Admitting: Internal Medicine

## 2024-02-24 DIAGNOSIS — M79651 Pain in right thigh: Secondary | ICD-10-CM

## 2024-02-24 DIAGNOSIS — Z1231 Encounter for screening mammogram for malignant neoplasm of breast: Secondary | ICD-10-CM

## 2024-02-25 DIAGNOSIS — R000 Tachycardia, unspecified: Secondary | ICD-10-CM | POA: Diagnosis not present

## 2024-02-25 DIAGNOSIS — I10 Essential (primary) hypertension: Secondary | ICD-10-CM | POA: Diagnosis not present

## 2024-02-25 DIAGNOSIS — R609 Edema, unspecified: Secondary | ICD-10-CM | POA: Diagnosis not present

## 2024-02-25 DIAGNOSIS — E782 Mixed hyperlipidemia: Secondary | ICD-10-CM | POA: Diagnosis not present

## 2024-02-25 DIAGNOSIS — M25551 Pain in right hip: Secondary | ICD-10-CM | POA: Diagnosis not present

## 2024-02-25 DIAGNOSIS — K219 Gastro-esophageal reflux disease without esophagitis: Secondary | ICD-10-CM | POA: Diagnosis not present

## 2024-02-25 DIAGNOSIS — G8929 Other chronic pain: Secondary | ICD-10-CM | POA: Diagnosis not present

## 2024-03-08 DIAGNOSIS — M25651 Stiffness of right hip, not elsewhere classified: Secondary | ICD-10-CM | POA: Diagnosis not present

## 2024-03-08 DIAGNOSIS — M25551 Pain in right hip: Secondary | ICD-10-CM | POA: Diagnosis not present

## 2024-03-09 ENCOUNTER — Ambulatory Visit
Admission: RE | Admit: 2024-03-09 | Discharge: 2024-03-09 | Disposition: A | Discharge status: Home / Self Care | Source: Ambulatory Visit | Attending: Internal Medicine | Admitting: Internal Medicine

## 2024-03-09 DIAGNOSIS — M79651 Pain in right thigh: Secondary | ICD-10-CM

## 2024-03-09 DIAGNOSIS — M25551 Pain in right hip: Secondary | ICD-10-CM | POA: Diagnosis not present

## 2024-03-21 DIAGNOSIS — Z961 Presence of intraocular lens: Secondary | ICD-10-CM | POA: Diagnosis not present

## 2024-03-29 DIAGNOSIS — M25551 Pain in right hip: Secondary | ICD-10-CM | POA: Diagnosis not present

## 2024-03-29 DIAGNOSIS — M546 Pain in thoracic spine: Secondary | ICD-10-CM | POA: Diagnosis not present

## 2024-03-29 DIAGNOSIS — R296 Repeated falls: Secondary | ICD-10-CM | POA: Diagnosis not present

## 2024-03-29 DIAGNOSIS — M25651 Stiffness of right hip, not elsewhere classified: Secondary | ICD-10-CM | POA: Diagnosis not present

## 2024-03-29 DIAGNOSIS — S299XXA Unspecified injury of thorax, initial encounter: Secondary | ICD-10-CM | POA: Diagnosis not present

## 2024-04-01 DIAGNOSIS — Z79899 Other long term (current) drug therapy: Secondary | ICD-10-CM | POA: Diagnosis not present

## 2024-04-04 DIAGNOSIS — M25551 Pain in right hip: Secondary | ICD-10-CM | POA: Diagnosis not present

## 2024-04-04 DIAGNOSIS — M25651 Stiffness of right hip, not elsewhere classified: Secondary | ICD-10-CM | POA: Diagnosis not present

## 2024-04-06 DIAGNOSIS — J90 Pleural effusion, not elsewhere classified: Secondary | ICD-10-CM | POA: Diagnosis not present

## 2024-04-26 DIAGNOSIS — R8290 Unspecified abnormal findings in urine: Secondary | ICD-10-CM | POA: Diagnosis not present

## 2024-04-26 DIAGNOSIS — R399 Unspecified symptoms and signs involving the genitourinary system: Secondary | ICD-10-CM | POA: Diagnosis not present

## 2024-04-26 DIAGNOSIS — J90 Pleural effusion, not elsewhere classified: Secondary | ICD-10-CM | POA: Diagnosis not present

## 2024-05-09 ENCOUNTER — Other Ambulatory Visit: Payer: Self-pay | Admitting: Internal Medicine

## 2024-05-09 DIAGNOSIS — R0602 Shortness of breath: Secondary | ICD-10-CM

## 2024-05-09 DIAGNOSIS — R5383 Other fatigue: Secondary | ICD-10-CM | POA: Diagnosis not present

## 2024-05-09 DIAGNOSIS — G720 Drug-induced myopathy: Secondary | ICD-10-CM | POA: Diagnosis not present

## 2024-05-09 DIAGNOSIS — R000 Tachycardia, unspecified: Secondary | ICD-10-CM | POA: Diagnosis not present

## 2024-05-09 DIAGNOSIS — J90 Pleural effusion, not elsewhere classified: Secondary | ICD-10-CM

## 2024-05-09 DIAGNOSIS — R609 Edema, unspecified: Secondary | ICD-10-CM | POA: Diagnosis not present

## 2024-05-09 DIAGNOSIS — I10 Essential (primary) hypertension: Secondary | ICD-10-CM | POA: Diagnosis not present

## 2024-05-09 DIAGNOSIS — K219 Gastro-esophageal reflux disease without esophagitis: Secondary | ICD-10-CM | POA: Diagnosis not present

## 2024-05-09 DIAGNOSIS — R0609 Other forms of dyspnea: Secondary | ICD-10-CM | POA: Diagnosis not present

## 2024-05-09 DIAGNOSIS — R002 Palpitations: Secondary | ICD-10-CM | POA: Diagnosis not present

## 2024-05-09 DIAGNOSIS — E782 Mixed hyperlipidemia: Secondary | ICD-10-CM | POA: Diagnosis not present

## 2024-05-09 DIAGNOSIS — R5381 Other malaise: Secondary | ICD-10-CM | POA: Diagnosis not present

## 2024-05-11 DIAGNOSIS — R0602 Shortness of breath: Secondary | ICD-10-CM | POA: Diagnosis not present

## 2024-05-18 ENCOUNTER — Ambulatory Visit
Admission: RE | Admit: 2024-05-18 | Discharge: 2024-05-18 | Disposition: A | Discharge status: Home / Self Care | Source: Ambulatory Visit | Attending: Internal Medicine | Admitting: Internal Medicine

## 2024-05-18 DIAGNOSIS — I3139 Other pericardial effusion (noninflammatory): Secondary | ICD-10-CM | POA: Diagnosis not present

## 2024-05-18 DIAGNOSIS — J90 Pleural effusion, not elsewhere classified: Secondary | ICD-10-CM | POA: Insufficient documentation

## 2024-05-18 DIAGNOSIS — R918 Other nonspecific abnormal finding of lung field: Secondary | ICD-10-CM | POA: Diagnosis not present

## 2024-05-18 DIAGNOSIS — R0602 Shortness of breath: Secondary | ICD-10-CM | POA: Insufficient documentation

## 2024-05-18 MED ORDER — IOHEXOL 300 MG/ML  SOLN
75.0000 mL | Freq: Once | INTRAMUSCULAR | Status: AC | PRN
  Administered 2024-05-18 (×2): 75 mL via INTRAVENOUS

## 2024-05-30 DIAGNOSIS — E878 Other disorders of electrolyte and fluid balance, not elsewhere classified: Secondary | ICD-10-CM | POA: Diagnosis not present

## 2024-05-30 DIAGNOSIS — J811 Chronic pulmonary edema: Secondary | ICD-10-CM | POA: Diagnosis not present

## 2024-05-30 DIAGNOSIS — J90 Pleural effusion, not elsewhere classified: Secondary | ICD-10-CM | POA: Diagnosis not present

## 2024-06-02 ENCOUNTER — Ambulatory Visit
Admission: RE | Admit: 2024-06-02 | Discharge: 2024-06-02 | Disposition: A | Discharge status: Home / Self Care | Source: Ambulatory Visit | Attending: Internal Medicine | Admitting: Internal Medicine

## 2024-06-02 DIAGNOSIS — Z1231 Encounter for screening mammogram for malignant neoplasm of breast: Secondary | ICD-10-CM | POA: Insufficient documentation

## 2024-06-16 DIAGNOSIS — R5383 Other fatigue: Secondary | ICD-10-CM | POA: Diagnosis not present

## 2024-06-16 DIAGNOSIS — J811 Chronic pulmonary edema: Secondary | ICD-10-CM | POA: Diagnosis not present

## 2024-06-16 DIAGNOSIS — Z2821 Immunization not carried out because of patient refusal: Secondary | ICD-10-CM | POA: Diagnosis not present

## 2024-06-16 DIAGNOSIS — J90 Pleural effusion, not elsewhere classified: Secondary | ICD-10-CM | POA: Diagnosis not present

## 2024-06-16 DIAGNOSIS — R5382 Chronic fatigue, unspecified: Secondary | ICD-10-CM | POA: Diagnosis not present

## 2024-06-21 DIAGNOSIS — M25551 Pain in right hip: Secondary | ICD-10-CM | POA: Diagnosis not present

## 2024-06-21 DIAGNOSIS — M1611 Unilateral primary osteoarthritis, right hip: Secondary | ICD-10-CM | POA: Diagnosis not present

## 2024-06-21 DIAGNOSIS — Z8781 Personal history of (healed) traumatic fracture: Secondary | ICD-10-CM | POA: Diagnosis not present

## 2024-06-27 DIAGNOSIS — T466X5A Adverse effect of antihyperlipidemic and antiarteriosclerotic drugs, initial encounter: Secondary | ICD-10-CM | POA: Diagnosis not present

## 2024-06-27 DIAGNOSIS — K219 Gastro-esophageal reflux disease without esophagitis: Secondary | ICD-10-CM | POA: Diagnosis not present

## 2024-06-27 DIAGNOSIS — I10 Essential (primary) hypertension: Secondary | ICD-10-CM | POA: Diagnosis not present

## 2024-06-27 DIAGNOSIS — M25551 Pain in right hip: Secondary | ICD-10-CM | POA: Diagnosis not present

## 2024-06-27 DIAGNOSIS — Z79899 Other long term (current) drug therapy: Secondary | ICD-10-CM | POA: Diagnosis not present

## 2024-06-27 DIAGNOSIS — S72001A Fracture of unspecified part of neck of right femur, initial encounter for closed fracture: Secondary | ICD-10-CM | POA: Diagnosis not present

## 2024-06-27 DIAGNOSIS — M810 Age-related osteoporosis without current pathological fracture: Secondary | ICD-10-CM | POA: Diagnosis not present

## 2024-06-27 DIAGNOSIS — F321 Major depressive disorder, single episode, moderate: Secondary | ICD-10-CM | POA: Diagnosis not present

## 2024-06-27 DIAGNOSIS — E782 Mixed hyperlipidemia: Secondary | ICD-10-CM | POA: Diagnosis not present

## 2024-06-27 DIAGNOSIS — R8290 Unspecified abnormal findings in urine: Secondary | ICD-10-CM | POA: Diagnosis not present

## 2024-06-27 DIAGNOSIS — G720 Drug-induced myopathy: Secondary | ICD-10-CM | POA: Diagnosis not present

## 2024-07-11 DIAGNOSIS — F32A Depression, unspecified: Secondary | ICD-10-CM | POA: Diagnosis not present

## 2024-07-11 DIAGNOSIS — E782 Mixed hyperlipidemia: Secondary | ICD-10-CM | POA: Diagnosis not present

## 2024-07-11 DIAGNOSIS — I10 Essential (primary) hypertension: Secondary | ICD-10-CM | POA: Diagnosis not present

## 2024-07-11 DIAGNOSIS — K219 Gastro-esophageal reflux disease without esophagitis: Secondary | ICD-10-CM | POA: Diagnosis not present

## 2024-07-11 DIAGNOSIS — R609 Edema, unspecified: Secondary | ICD-10-CM | POA: Diagnosis not present

## 2024-07-11 DIAGNOSIS — R002 Palpitations: Secondary | ICD-10-CM | POA: Diagnosis not present

## 2024-07-20 DIAGNOSIS — D2261 Melanocytic nevi of right upper limb, including shoulder: Secondary | ICD-10-CM | POA: Diagnosis not present

## 2024-07-20 DIAGNOSIS — Z08 Encounter for follow-up examination after completed treatment for malignant neoplasm: Secondary | ICD-10-CM | POA: Diagnosis not present

## 2024-07-20 DIAGNOSIS — D2262 Melanocytic nevi of left upper limb, including shoulder: Secondary | ICD-10-CM | POA: Diagnosis not present

## 2024-07-20 DIAGNOSIS — D2272 Melanocytic nevi of left lower limb, including hip: Secondary | ICD-10-CM | POA: Diagnosis not present

## 2024-07-20 DIAGNOSIS — D225 Melanocytic nevi of trunk: Secondary | ICD-10-CM | POA: Diagnosis not present

## 2024-07-20 DIAGNOSIS — B351 Tinea unguium: Secondary | ICD-10-CM | POA: Diagnosis not present

## 2024-07-20 DIAGNOSIS — Z85828 Personal history of other malignant neoplasm of skin: Secondary | ICD-10-CM | POA: Diagnosis not present

## 2024-07-20 DIAGNOSIS — D2271 Melanocytic nevi of right lower limb, including hip: Secondary | ICD-10-CM | POA: Diagnosis not present

## 2024-07-20 DIAGNOSIS — L821 Other seborrheic keratosis: Secondary | ICD-10-CM | POA: Diagnosis not present

## 2024-07-22 DIAGNOSIS — M25551 Pain in right hip: Secondary | ICD-10-CM | POA: Diagnosis not present

## 2024-07-26 DIAGNOSIS — M1611 Unilateral primary osteoarthritis, right hip: Secondary | ICD-10-CM | POA: Diagnosis not present

## 2024-07-26 DIAGNOSIS — T8484XA Pain due to internal orthopedic prosthetic devices, implants and grafts, initial encounter: Secondary | ICD-10-CM | POA: Diagnosis not present

## 2024-07-26 DIAGNOSIS — Z8781 Personal history of (healed) traumatic fracture: Secondary | ICD-10-CM | POA: Diagnosis not present

## 2024-07-26 NOTE — Progress Notes (Signed)
 History of Present Illness: Meagan Cain is an 78 y.o. female presents for evaluation of her right hip.  The patient reports she had pain in her right hip beginning in the fall 2023 and was worked up and found to have a tendon tear over her hip.  She was undergoing injections and other treatments with minimal improvement when she had a fall in the beginning of 2024 breaking her right hip.  She underwent an intramedullary nail fixation for hip fracture and reports that she had a uneventful healing process went home after surgery and is slowly gotten back to walking and activity.  She reports over the last year since the fracture she has had throbbing aching pain intermittent in nature which is slowly worsened in her groin as well as lateral hip pain.  She has undergone treatments with Tylenol  physical therapy and had previous injections most recently of which was last week in her lateral hip.  She reports short-term relief from these injections and that the groin pain is becoming more constant with ambulation.  She reports pain is worse about a 10 out of 10 and is about a 5 out of 10 at rest today.  The patient denies fevers, chills, numbness, tingling, shortness of breath, chest pain, recent illness, or any new trauma.  Past Medical History: Past Medical History:  Diagnosis Date  . Allergic state   . Arthritis   . Cataract cortical, senile   . Chronic vaginitis   . Depression   . GERD (gastroesophageal reflux disease)   . History of shingles   . History of skin cancer   . Hyperlipidemia   . Hypertension   . Non-cardiac chest pain    History of  . Osteoporosis, post-menopausal   . Pericardial effusion ([facility])    History of   . Thyroid  disease     Past Surgical History: Past Surgical History:  Procedure Laterality Date  . COLONOSCOPY  03/03/2018   Adenomatous Polyps: CBF 02/2021  . COLONOSCOPY  10/22/2021   Internal hemorrhoids/Diverticulosis/Otherwise normal/PHx CP/No repeat due to  age/TKT  . EGD  10/22/2021   Gastritis/No repeat/TKT  . APPENDECTOMY    . CATARACT EXTRACTION  2012  . CHOLECYSTECTOMY    . EYE SURGERY    . HYSTERECTOMY    . Skin cancer    . TONSILLECTOMY  as a child    Past Family History: Family History  Problem Relation Age of Onset  . Diabetes Mother   . Allergies Mother   . Osteoporosis (Thinning of bones) Mother   . Diabetes type II Mother   . Glaucoma Mother   . Coronary Artery Disease (Blocked arteries around heart) Mother   . Hyperlipidemia (Elevated cholesterol) Mother   . High blood pressure (Hypertension) Mother   . Myocardial Infarction (Heart attack) Father   . Coronary Artery Disease (Blocked arteries around heart) Father   . High blood pressure (Hypertension) Father   . Myocardial Infarction (Heart attack) Other   . Diabetes type II Other   . Coronary Artery Disease (Blocked arteries around heart) Brother   . Diabetes type II Brother   . High blood pressure (Hypertension) Brother   . Melanoma Neg Hx     Medications: Current Outpatient Medications  Medication Sig Dispense Refill  . aspirin  81 MG EC tablet Take 81 mg by mouth once daily.    . chlorthalidone 25 MG tablet Take 1 tablet (25 mg total) by mouth once daily 90 tablet 3  . cholecalciferol (VITAMIN  D3) 2,000 unit capsule Take 2,000 Units by mouth once daily.    SABRA conjugated estrogens  (PREMARIN ) 0.625 mg/gram vaginal cream Place 1 g vaginally twice a week 90 g 3  . dilTIAZem (CARDIZEM CD) 180 MG CD capsule Take 1 capsule (180 mg total) by mouth once daily 90 capsule 3  . escitalopram oxalate (LEXAPRO) 10 MG tablet TAKE 1 TABLET (10 MG) BY MOUTH EVERY DAY 90 tablet 1  . gabapentin (NEURONTIN) 100 MG capsule Take 1 capsule (100 mg total) by mouth at bedtime 90 capsule 3  . hydrocortisone 2.5 % cream Apply topically 2 (two) times daily Apply after BM 3.5 g 0  . levothyroxine  (SYNTHROID ) 75 MCG tablet TAKE 1 TAB EVERY DAY 30-60 MINS BEFORE BREAKFAST ON AN EMPTY STOMACH  AND WITH A GLASS OF WATER 90 tablet 1  . omeprazole (PRILOSEC) 40 MG DR capsule Take 1 capsule (40 mg total) by mouth once daily 90 capsule 3  . potassium chloride  (KLOR-CON ) 10 MEQ ER tablet Take 1 tablet (10 mEq total) by mouth once daily 90 tablet 1  . rosuvastatin  (CRESTOR ) 5 MG tablet TAKE 1 TABLET BY MOUTH EVERY DAY 90 tablet 1   No current facility-administered medications for this visit.    Allergies: Allergies  Allergen Reactions  . Furosemide (Bulk) Diarrhea  . Boniva [Ibandronate] Unknown  . Ceftin [Cefuroxime Axetil] Unknown  . Cephalosporins Nausea And Vomiting  . Ibandronic Acid Nausea And Vomiting  . Sulfa (Sulfonamide Antibiotics) Unknown  . Sulfasalazine Rash     Visit Vitals: Vitals:   07/26/24 1029  BP: (!) 140/88     Review of Systems:  A comprehensive 14 point ROS was performed, reviewed, and the pertinent orthopaedic findings are documented in the HPI.   Physical Exam: Body mass index is 24.56 kg/m. General/Constitutional: No apparent distress: well-nourished and well developed. Lymphatic: No palpable adenopathy. Respiratory: Non-labored breathing Vascular: No edema, swelling or tenderness, except as noted in detailed exam. Integumentary: No impressive skin lesions present, except as noted in detailed exam. Neuro/Psych: Normal mood and affect, oriented to person, place and time. Musculoskeletal: Normal, except as noted in detailed exam and in HPI.  Right hip exam  SKIN: intact well-healed incisions over the lateral hip no erythema or swelling SWELLING: none WARMTH: no warmth TENDERNESS: Tender to palpation of the lateral aspect of the hip at the site of the cephalomedullary blade, Stinchfield Positive ROM: 10 degrees internal rotation and 30 degrees external rotation and pain with internal rotation,; Hip Flexion 95 STRENGTH: limited by pain GAIT: antalgic and limp favoring the right STABILITY: stable to testing CREPITUS: no LEG LENGTH  DISCREPANCY: left longer by 2 cm NEUROLOGICAL EXAM: normal VASCULAR EXAM: normal LUMBAR SPINE:  tenderness: no    straight leg raising sign: no    motor exam: normal  The contralateral hip was examined for comparison and it showed: TENDERNESS: none ROM: normal and full STRENGTH: normal STABILITY: stable to testing  Hip Imaging :  I reviewed AP pelvis and AP right femur and lateral right femur x-rays performed 06/21/2024 images reviewed by myself.  The patient is status post right femur intramedullary nail with a helical blade for a right intertrochanteric fracture.  The fracture appears grossly healed at this time with no evidence of persistent fracture lines.  There is mixed sclerotic and lucent lesions within the femoral head as well as some joint space narrowing and sclerosis.  There is chronic changes of the greater trochanter.  There has been some sliding of  the interlocking blade with protrusion laterally out of the cortex.  No distal interlock screw noted.  Previous and outside records reviewed  Assessment:  Status post right femur intramedullary nail, right hip arthritis posttraumatic, leg length inequality   Plan: I reviewed the clinical intragraft findings with the patient.  She has physical exam findings for both bursitis and hip arthritis.  She has arthritis on her x-ray imaging and degenerative changes to the femoral head itself.  We discussed treatment options for both the bursitis and the prominent hardware as well as the arthritis.  We discussed continue conservative treatments exercises therapy and injections over the bursa and possibly in the joint itself.  We discussed surgery with the plan to shorten the blade to reduce the risk of bursitis and we discussed total hip conversion to a hip replacement to treat both the prominent hardware and her underlying arthritis and labral tears.  The patient wants to discuss with her family the risks and benefits of the different  procedures and make a decision on which way she would like to go and she will contact the office when she has a decision on what type of procedure she would like to proceed with or if she like to continue with conservative measures.  All of her questions were answered and the imaging was reviewed with her.  She will follow-up and let us  know which way she wants to go.  Portions of this record have been created using Scientist, clinical (histocompatibility and immunogenetics).  Dictation errors have been sought, but may not have been identified and corrected.  Arthea Sheer MD

## 2024-08-15 DIAGNOSIS — S72001A Fracture of unspecified part of neck of right femur, initial encounter for closed fracture: Secondary | ICD-10-CM | POA: Diagnosis not present

## 2024-08-19 ENCOUNTER — Emergency Department

## 2024-08-19 ENCOUNTER — Inpatient Hospital Stay

## 2024-08-19 ENCOUNTER — Inpatient Hospital Stay
Admission: EM | Admit: 2024-08-19 | Discharge: 2024-08-24 | DRG: 641 | Disposition: A | Discharge status: Home / Self Care | Source: Ambulatory Visit | Attending: Emergency Medicine | Admitting: Emergency Medicine

## 2024-08-19 DIAGNOSIS — Z888 Allergy status to other drugs, medicaments and biological substances status: Secondary | ICD-10-CM

## 2024-08-19 DIAGNOSIS — I10 Essential (primary) hypertension: Secondary | ICD-10-CM | POA: Diagnosis present

## 2024-08-19 DIAGNOSIS — R110 Nausea: Secondary | ICD-10-CM | POA: Diagnosis not present

## 2024-08-19 DIAGNOSIS — E876 Hypokalemia: Secondary | ICD-10-CM | POA: Diagnosis not present

## 2024-08-19 DIAGNOSIS — Z881 Allergy status to other antibiotic agents status: Secondary | ICD-10-CM | POA: Diagnosis not present

## 2024-08-19 DIAGNOSIS — Z8249 Family history of ischemic heart disease and other diseases of the circulatory system: Secondary | ICD-10-CM | POA: Diagnosis not present

## 2024-08-19 DIAGNOSIS — E878 Other disorders of electrolyte and fluid balance, not elsewhere classified: Secondary | ICD-10-CM | POA: Diagnosis not present

## 2024-08-19 DIAGNOSIS — W1830XA Fall on same level, unspecified, initial encounter: Secondary | ICD-10-CM | POA: Diagnosis present

## 2024-08-19 DIAGNOSIS — Z79899 Other long term (current) drug therapy: Secondary | ICD-10-CM | POA: Diagnosis not present

## 2024-08-19 DIAGNOSIS — Z882 Allergy status to sulfonamides status: Secondary | ICD-10-CM | POA: Diagnosis not present

## 2024-08-19 DIAGNOSIS — E860 Dehydration: Secondary | ICD-10-CM | POA: Diagnosis not present

## 2024-08-19 DIAGNOSIS — Z90710 Acquired absence of both cervix and uterus: Secondary | ICD-10-CM | POA: Diagnosis not present

## 2024-08-19 DIAGNOSIS — Z79890 Hormone replacement therapy (postmenopausal): Secondary | ICD-10-CM | POA: Diagnosis not present

## 2024-08-19 DIAGNOSIS — Z9049 Acquired absence of other specified parts of digestive tract: Secondary | ICD-10-CM

## 2024-08-19 DIAGNOSIS — Z85828 Personal history of other malignant neoplasm of skin: Secondary | ICD-10-CM

## 2024-08-19 DIAGNOSIS — T502X5A Adverse effect of carbonic-anhydrase inhibitors, benzothiadiazides and other diuretics, initial encounter: Secondary | ICD-10-CM | POA: Diagnosis present

## 2024-08-19 DIAGNOSIS — Z961 Presence of intraocular lens: Secondary | ICD-10-CM | POA: Diagnosis present

## 2024-08-19 DIAGNOSIS — F32A Depression, unspecified: Secondary | ICD-10-CM | POA: Diagnosis present

## 2024-08-19 DIAGNOSIS — E785 Hyperlipidemia, unspecified: Secondary | ICD-10-CM | POA: Diagnosis present

## 2024-08-19 DIAGNOSIS — S2231XA Fracture of one rib, right side, initial encounter for closed fracture: Secondary | ICD-10-CM | POA: Diagnosis not present

## 2024-08-19 DIAGNOSIS — R0781 Pleurodynia: Secondary | ICD-10-CM | POA: Diagnosis not present

## 2024-08-19 DIAGNOSIS — D72829 Elevated white blood cell count, unspecified: Secondary | ICD-10-CM | POA: Diagnosis not present

## 2024-08-19 DIAGNOSIS — R112 Nausea with vomiting, unspecified: Secondary | ICD-10-CM | POA: Diagnosis not present

## 2024-08-19 DIAGNOSIS — E039 Hypothyroidism, unspecified: Secondary | ICD-10-CM | POA: Diagnosis present

## 2024-08-19 DIAGNOSIS — Z7982 Long term (current) use of aspirin: Secondary | ICD-10-CM

## 2024-08-19 DIAGNOSIS — E871 Hypo-osmolality and hyponatremia: Secondary | ICD-10-CM | POA: Diagnosis not present

## 2024-08-19 DIAGNOSIS — Z833 Family history of diabetes mellitus: Secondary | ICD-10-CM

## 2024-08-19 LAB — COMPREHENSIVE METABOLIC PANEL WITH GFR
ALT: 18 U/L (ref 0–44)
AST: 23 U/L (ref 15–41)
Albumin: 4.3 g/dL (ref 3.5–5.0)
Alkaline Phosphatase: 94 U/L (ref 38–126)
Anion gap: 13 (ref 5–15)
BUN: 9 mg/dL (ref 8–23)
CO2: 33 mmol/L — ABNORMAL HIGH (ref 22–32)
Calcium: 9.4 mg/dL (ref 8.9–10.3)
Chloride: 69 mmol/L — ABNORMAL LOW (ref 98–111)
Creatinine, Ser: 0.56 mg/dL (ref 0.44–1.00)
GFR, Estimated: >60 mL/min (ref >60)
Glucose, Bld: 114 mg/dL — ABNORMAL HIGH (ref 70–99)
Potassium: 2.5 mmol/L — CL (ref 3.5–5.1)
Sodium: 115 mmol/L — CL (ref 135–145)
Total Bilirubin: 0.7 mg/dL (ref 0.0–1.2)
Total Protein: 7.3 g/dL (ref 6.5–8.1)

## 2024-08-19 LAB — CBC
HCT: 36.1 % (ref 36.0–46.0)
Hemoglobin: 13.4 g/dL (ref 12.0–15.0)
MCH: 28.6 pg (ref 26.0–34.0)
MCHC: 37.1 g/dL — ABNORMAL HIGH (ref 30.0–36.0)
MCV: 77 fL — ABNORMAL LOW (ref 80.0–100.0)
Platelets: 328 K/uL (ref 150–400)
RBC: 4.69 MIL/uL (ref 3.87–5.11)
RDW: 13.2 % (ref 11.5–15.5)
WBC: 11.6 K/uL — ABNORMAL HIGH (ref 4.0–10.5)
nRBC: 0 % (ref 0.0–0.2)

## 2024-08-19 LAB — MRSA NEXT GEN BY PCR, NASAL: MRSA by PCR Next Gen: NOT DETECTED

## 2024-08-19 LAB — URINALYSIS, ROUTINE W REFLEX MICROSCOPIC
Bilirubin Urine: NEGATIVE
Glucose, UA: NEGATIVE mg/dL
Hgb urine dipstick: NEGATIVE
Ketones, ur: NEGATIVE mg/dL
Leukocytes,Ua: NEGATIVE
Nitrite: NEGATIVE
Protein, ur: NEGATIVE mg/dL
Specific Gravity, Urine: 1.004 — ABNORMAL LOW (ref 1.005–1.030)
pH: 7 (ref 5.0–8.0)

## 2024-08-19 LAB — OSMOLALITY: Osmolality: 302 mosm/kg — ABNORMAL HIGH (ref 275–295)

## 2024-08-19 LAB — SODIUM, URINE, RANDOM: Sodium, Ur: <30 mmol/L

## 2024-08-19 LAB — MAGNESIUM: Magnesium: 2.1 mg/dL (ref 1.7–2.4)

## 2024-08-19 LAB — SODIUM: Sodium: 115 mmol/L — CL (ref 135–145)

## 2024-08-19 MED ORDER — HEPARIN SODIUM (PORCINE) 5000 UNIT/ML IJ SOLN
5000.0000 [IU] | Freq: Three times a day (TID) | INTRAMUSCULAR | Status: DC
  Administered 2024-08-19 – 2024-08-24 (×15): 5000 [IU] via SUBCUTANEOUS
  Filled 2024-08-19 (×15): qty 1

## 2024-08-19 MED ORDER — POTASSIUM CHLORIDE 10 MEQ/100ML IV SOLN
10.0000 meq | INTRAVENOUS | Status: DC
  Administered 2024-08-19 (×3): 10 meq via INTRAVENOUS
  Filled 2024-08-19 (×3): qty 100

## 2024-08-19 MED ORDER — POLYETHYLENE GLYCOL 3350 17 G PO PACK
17.0000 g | PACK | Freq: Every day | ORAL | Status: DC | PRN
  Administered 2024-08-21: 17 g via ORAL
  Filled 2024-08-19: qty 1

## 2024-08-19 MED ORDER — ONDANSETRON HCL 4 MG/2ML IJ SOLN: 4.0000 mg | Freq: Once | INTRAMUSCULAR | Status: DC

## 2024-08-19 MED ORDER — SODIUM CHLORIDE 0.9 % IV BOLUS: 500.0000 mL | Freq: Once | INTRAVENOUS | Status: DC

## 2024-08-19 MED ORDER — CHLORHEXIDINE GLUCONATE CLOTH 2 % EX PADS
6.0000 | MEDICATED_PAD | Freq: Every day | CUTANEOUS | Status: DC
  Administered 2024-08-21: 6 via TOPICAL
  Filled 2024-08-19 (×2): qty 6

## 2024-08-19 MED ORDER — LACTATED RINGERS IV BOLUS
250.0000 mL | Freq: Once | INTRAVENOUS | Status: AC
  Administered 2024-08-19: 250 mL via INTRAVENOUS

## 2024-08-19 MED ORDER — POTASSIUM CHLORIDE 10 MEQ/100ML IV SOLN
10.0000 meq | INTRAVENOUS | Status: AC
Start: 2024-08-19 — End: 2024-08-19
  Administered 2024-08-19 (×2): 10 meq via INTRAVENOUS
  Filled 2024-08-19 (×2): qty 100

## 2024-08-19 MED ORDER — POTASSIUM CHLORIDE 10 MEQ/100ML IV SOLN: 10.0000 meq | INTRAVENOUS | Status: DC

## 2024-08-19 MED ORDER — DOCUSATE SODIUM 100 MG PO CAPS
100.0000 mg | ORAL_CAPSULE | Freq: Two times a day (BID) | ORAL | Status: DC | PRN
  Administered 2024-08-20 – 2024-08-24 (×3): 100 mg via ORAL
  Filled 2024-08-19 (×3): qty 1

## 2024-08-19 NOTE — ED Provider Notes (Signed)
 [facility] Provider Note    Event Date/Time   First MD Initiated Contact with Patient 08/19/24 1417     (approximate)   History   Abnormal Labs   HPI  Meagan Cain is a 78 y.o. female who has been feeling nauseous and lightheaded for about 1 week.  She was sent over due to concerns for abnormal blood work.  Patient reports that she had some medications changed for her blood pressure and that she just feels nauseous whenever she eats she denies any abdominal pain.  She reports increasing weakness.  She does report a fall where she landed on her right chest wall but she denies any LOC, hitting her head or hurting her neck or any other injuries.  Physical Exam   Triage Vital Signs: ED Triage Vitals  Encounter Vitals Group     BP 08/19/24 1345 128/75     Girls Systolic BP Percentile --      Girls Diastolic BP Percentile --      Boys Systolic BP Percentile --      Boys Diastolic BP Percentile --      Pulse Rate 08/19/24 1345 73     Resp 08/19/24 1345 18     Temp 08/19/24 1345 98.3 F (36.8 C)     Temp Source 08/19/24 1345 Oral     SpO2 08/19/24 1345 99 %     Weight 08/19/24 1344 120 lb (54.4 kg)     Height 08/19/24 1344 5' 1 (1.549 m)     Head Circumference --      Peak Flow --      Pain Score 08/19/24 1344 0     Pain Loc --      Pain Education --      Exclude from Growth Chart --     Most recent vital signs: Vitals:   08/19/24 1345  BP: 128/75  Pulse: 73  Resp: 18  Temp: 98.3 F (36.8 C)  SpO2: 99%     General: Awake, no distress.  CV:  Good peripheral perfusion.  Resp:  Normal effort.  Abd:  No distention.  Other:  Right chest wall tenderness able to move her legs abdomen soft and nontender no C-spine tenderness no hematoma to the head No swelling noted  ED Results / Procedures / Treatments   Labs (all labs ordered are listed, but only abnormal results are displayed) Labs Reviewed  CBC - Abnormal; Notable for the following  components:      Result Value   WBC 11.6 (*)    MCV 77.0 (*)    MCHC 37.1 (*)    All other components within normal limits  COMPREHENSIVE METABOLIC PANEL WITH GFR  URINALYSIS, ROUTINE W REFLEX MICROSCOPIC     EKG  My interpretation of EKG:  Normal sinus rate of 75 with some ST sloping concerning for potential U wave without any T wave versions, normal intervals otherwise  RADIOLOGY I have reviewed the xray personally and interpreted no evidence of pneumothorax   PROCEDURES:  Critical Care performed: Yes, see critical care procedure note(s)  .1-3 Lead EKG Interpretation  Performed by: Ernest Ronal BRAVO, MD Authorized by: Ernest Ronal BRAVO, MD     Interpretation: normal     ECG rate:  70   ECG rate assessment: normal     Rhythm: sinus rhythm     Ectopy: none     Conduction: normal   .Critical Care  Performed by: Ernest Ronal BRAVO, MD  Authorized by: Ernest Ronal BRAVO, MD   Critical care provider statement:    Critical care time (minutes):  30   Critical care was necessary to treat or prevent imminent or life-threatening deterioration of the following conditions: electrolyte abn with ekg changes.   Critical care was time spent personally by me on the following activities:  Development of treatment plan with patient or surrogate, discussions with consultants, evaluation of patient's response to treatment, examination of patient, ordering and review of laboratory studies, ordering and review of radiographic studies, ordering and performing treatments and interventions, pulse oximetry, re-evaluation of patient's condition and review of old charts    MEDICATIONS ORDERED IN ED: Medications - No data to display   IMPRESSION / MDM / ASSESSMENT AND PLAN / ED COURSE  I reviewed the triage vital signs and the nursing notes.   Patient's presentation is most consistent with acute presentation with potential threat to life or bodily function.   Patient comes in with weakness.  No injury to the  head and no neck pain.  Will get x-ray to make sure no rib fracture although patient satting 100% so doubt pneumothorax.  Patient's abdomen is soft and nontender.  Her EKG does show some concerns for some U waves and potassium and sodium are significantly low.  Will place patient on the cardiac monitor and give patient some IV potassium due to concerns for significant hypokalemia leading to EKG abnormality  CBC shows a slightly elevated white count.  CMP showed sodium of 111 and potassium of 2.1  Will give patient some potassium and some gentle fluids and discussed with hospital team for admission  The patient is on the cardiac monitor to evaluate for evidence of arrhythmia and/or significant heart rate changes.      FINAL CLINICAL IMPRESSION(S) / ED DIAGNOSES   Final diagnoses:  Hyponatremia  Hypokalemia     Rx / DC Orders   ED Discharge Orders     None        Note:  This document was prepared using Dragon voice recognition software and may include unintentional dictation errors.   Ernest Ronal BRAVO, MD 08/19/24 [phone]

## 2024-08-19 NOTE — ED Notes (Signed)
 Line assessed. Line is patent. Pt repositioned and given warm blankets. Vitals WDL. CB within reach

## 2024-08-19 NOTE — H&P (Signed)
 NAME:  Meagan Cain, MRN:  [id number], DOB:  08-Jun-1946, LOS: 0 ADMISSION DATE:  08/19/2024, CONSULTATION DATE:  08/19/2024 REFERRING MD:  Dr. Ernest, CHIEF COMPLAINT:  Fatigue, Nausea, Hyponatremia on outpatient labs   Brief Pt Description / Synopsis:  78 yo. Female admitted with severe Hyponatremia (Na+ 115 on presentation), suspect in setting of nausea and poor PO intake along with newly prescribed Chlorthalidone.   History of Present Illness:  Meagan Cain is a 78 y.o female with a past medical history of hypertension, hyperlipidemia, pericardial effusion, and hypothyroidism who presents to [facility] ED on 08/19/24 from her PCP office due to abnormal labs.  She reports that for the past 2 weeks she has been nauseous with poor PO intake, along with fatigue, and dizziness/lightheadedness.  She was recently switched to Chlorthalidone from Diltiazem by her Cardiologist on 07/11/2024, and reports she hasn't felt well since starting it.  She actually stopped taking the Chlorthalidone and switched back to Diltiazem earlier this week on Monday 08/15/24.  She denies chest pain, shortness of breath, abdominal pain, diarrhea, vomiting, fever, dysuria.   Given her persistent symptoms she was seen at her PCP today, and was referred directly to the ED as labs showed a sodium of 115 and potassium of 2.1.  She also endorsees a fall where she landed on her right chest, but denies hitting her head or neck, loss of consciousness, or any other injuries.   ED Course: Initial Vital Signs: Temperature 98.3 F, pulse 73, RR 18, BP 128/75, SpO2 99% on room air Significant Labs: Sodium 115, potassium 2.5, chloride 69, bicarb 33, glucose 114, WBC 11.6 UA negative for UTI Imaging Chest X-ray>>IMPRESSION: Possible acute right fourth lateral rib fracture. No pneumothorax. Medications Administered: Potassium repletion ordered   PCCM asked to admit for further workup and treatment.  Please see Significant Hospital Events  section below for full detailed hospital course.   Pertinent  Medical History   Past Medical History:  Diagnosis Date   Arthritis    Cancer (HCC)    skin cancer   Chronic vaginitis    Cystocele    Depression    Effusion, pericardium    History of shingles    History of skin cancer    Hyperlipemia    Hypertension    Hypothyroidism    Osteoporosis    Rectocele    Thoracic compression fracture (HCC)    Urinary incontinence    Vaginal atrophy    Vitamin D deficiency     Micro Data:  11/14: MRSA PCR>>  Antimicrobials:   Anti-infectives (From admission, onward)    None       Significant Hospital Events: Including procedures, antibiotic start and stop dates in addition to other pertinent events   11/14: Presents to ED following 2 weeks of fatigue, nausea, poor PO intake.  Found to be severely Hyponatremia with Na of 115.  PCCM asked to admit. Nephrology consulted.   Interim History / Subjective:  As outlined above under Significant Hospital Events section  Objective   Blood pressure 128/75, pulse 73, temperature 98.3 F (36.8 C), temperature source Oral, resp. rate 18, height 5' 1 (1.549 m), weight 54.4 kg, SpO2 100%.       No intake or output data in the 24 hours ending 08/19/24 1612 Filed Weights   08/19/24 1344  Weight: 54.4 kg    Examination: General: Acutely ill appearing female, sitting in bed, on room air, in NAD HENT: Atraumatic, normocephalic, neck supple, no JVD, dry  MM Lungs: Clear to auscultation throughout, even, nonlabored, normal effort Cardiovascular: RRR, s1s2, no M/R/G Abdomen: Soft, nontender, nondistended, no guarding or rebound tenderness, BS+ x4 Extremities: Generalized weakness, normal bulk and tone, no deformities, trace edema BLE Neuro: Awake and alert, oriented x4, moves all extremities to commands and purposefully, no focal deficits noted, speech clear, PERRLA GU: deferred   Resolved Hospital Problem list     Assessment &  Plan:   #Severe Hyponatremia, suspect Hypotonic Hypovolemic in setting of 2 weeks of Nausea & poor PO intake along with newly prescribed Chlorthalidone  #Hypochloremia  #Hypokalemia -Monitor I&O's / urinary output -Follow BMP (Na+ q2h x2h, then q4h) -Ensure adequate renal perfusion -Avoid nephrotoxic agents as able -Replace electrolytes as indicated ~ Pharmacy following for assistance with electrolyte replacement -Goal Na+ correction of 6-8 mEq per 24 hrs (max 10 mEq) -Will give 250 LR bolus and repeat Na -Serum Osmolality, urine osmo and Urine Na+ -Nephrology consulted, appreciate input  #Nausea, suspect due to hyponatremia  -Obtain KUB to rule out mechanical process  #Mild Leukocytosis, suspect reactive -Monitor fever curve -Trend WBC's & Procalcitonin -Follow cultures as above -Chest X-ray without of pneumonia, no UTI on UA, abdominal exam is benign and diarrhea reported, no obvious wounds -Will hold off for now on empiric ABX pending cultures & sensitivities  #Acute Right 4th Rib Fracture -No evidence of PTX on chest xray -Supportive care -Pain control        Best Practice (right click and Reselect all SmartList Selections daily)   Diet/type: Regular consistency (see orders) DVT prophylaxis: prophylactic heparin   GI prophylaxis: N/A Lines: N/A Foley:  N/A Code Status:  full code Last date of multidisciplinary goals of care discussion [N/A]  11/14: Pt and her husband updated at bedside on plan of care.  Labs   CBC: Recent Labs  Lab 08/19/24 1350  WBC 11.6*  HGB 13.4  HCT 36.1  MCV 77.0*  PLT 328    Basic Metabolic Panel: Recent Labs  Lab 08/19/24 1350  NA 115*  K 2.5*  CL 69*  CO2 33*  GLUCOSE 114*  BUN 9  CREATININE 0.56  CALCIUM  9.4  MG 2.1   GFR: Estimated Creatinine Clearance: 43 mL/min (by C-G formula based on SCr of 0.56 mg/dL). Recent Labs  Lab 08/19/24 1350  WBC 11.6*    Liver Function Tests: Recent Labs  Lab  08/19/24 1350  AST 23  ALT 18  ALKPHOS 94  BILITOT 0.7  PROT 7.3  ALBUMIN 4.3   No results for input(s): LIPASE, AMYLASE in the last 168 hours. No results for input(s): AMMONIA in the last 168 hours.  ABG No results found for: PHART, PCO2ART, PO2ART, HCO3, TCO2, ACIDBASEDEF, O2SAT   Coagulation Profile: No results for input(s): INR, PROTIME in the last 168 hours.  Cardiac Enzymes: No results for input(s): CKTOTAL, CKMB, CKMBINDEX, TROPONINI in the last 168 hours.  HbA1C: No results found for: HGBA1C  CBG: No results for input(s): GLUCAP in the last 168 hours.  Review of Systems:   Positives in BOLD: Gen: Denies fever, chills, weight change, fatigue, night sweats HEENT: Denies blurred vision, double vision, hearing loss, tinnitus, sinus congestion, rhinorrhea, sore throat, neck stiffness, dysphagia PULM: Denies shortness of breath, cough, sputum production, hemoptysis, wheezing CV: Denies chest pain, edema, orthopnea, paroxysmal nocturnal dyspnea, palpitations GI: Denies abdominal pain, nausea, vomiting, diarrhea, hematochezia, melena, constipation, change in bowel habits GU: Denies dysuria, hematuria, polyuria, oliguria, urethral discharge Endocrine: Denies hot or cold intolerance, polyuria, polyphagia  or appetite change Derm: Denies rash, dry skin, scaling or peeling skin change Heme: Denies easy bruising, bleeding, bleeding gums Neuro: Denies headache, numbness, weakness, slurred speech, loss of memory or consciousness   Past Medical History:  She,  has a past medical history of Arthritis, Cancer (HCC), Chronic vaginitis, Cystocele, Depression, Effusion, pericardium, History of shingles, History of skin cancer, Hyperlipemia, Hypertension, Hypothyroidism, Osteoporosis, Rectocele, Thoracic compression fracture (HCC), Urinary incontinence, Vaginal atrophy, and Vitamin D deficiency.   Surgical History:   Past Surgical History:   Procedure Laterality Date   APPENDECTOMY     CHOLECYSTECTOMY  2003   COLONOSCOPY N/A 03/03/2018   Procedure: COLONOSCOPY;  Surgeon: Viktoria Lamar DASEN, MD;  [facility] ENDOSCOPY;  Service: Endoscopy;  Laterality: N/A;   CYSTOCELE REPAIR  07/30/2015   Procedure: ANTERIOR REPAIR (CYSTOCELE);  Surgeon: Gladis DELENA Dollar, MD;  Location: [facility] ORS;  Service: Gynecology;;   EYE SURGERY Bilateral    Cataract Extraction with IOL implants   INTRAMEDULLARY (IM) NAIL INTERTROCHANTERIC Right 11/05/2022   Procedure: INTRAMEDULLARY (IM) NAIL INTERTROCHANTERIC;  Surgeon: Leora Lynwood SAUNDERS, MD;  Location: [facility] ORS;  Service: Orthopedics;  Laterality: Right;   TONSILLECTOMY     VAGINAL HYSTERECTOMY       Social History:   reports that she has never smoked. She has never used smokeless tobacco. She reports that she does not drink alcohol and does not use drugs.   Family History:  Her family history includes Diabetes in her brother and mother; Heart disease in her brother, father, and mother; Throat cancer in her maternal grandmother. There is no history of Cancer, Breast cancer, Ovarian cancer, or Colon cancer.   Allergies Allergies  Allergen Reactions   Ceftin [Cefuroxime Axetil]    Cephalosporins Nausea And Vomiting   Ibandronate Nausea And Vomiting   Sulfa Antibiotics Rash     Home Medications  Prior to Admission medications   Medication Sig Start Date End Date Taking? Authorizing Provider  aspirin  EC 81 MG tablet Take 1 tablet (81 mg total) by mouth 2 (two) times daily. 11/06/22   Joshua Lin, PA-C  Cholecalciferol (VITAMIN D3) 2000 UNITS capsule Take 1,000 Units by mouth daily.     [provider]  conjugated estrogens  (PREMARIN ) vaginal cream Place 1 Applicatorful vaginally 2 (two) times a week. 1 gram two times a week 02/18/16   Defrancesco, Gladis DELENA, MD  Cyanocobalamin  (RA VITAMIN B-12 TR) 1000 MCG TBCR Take 1,000 mcg by mouth daily.     [provider]  docusate  sodium (COLACE) 100 MG capsule Take 1 capsule (100 mg total) by mouth 2 (two) times daily. 11/06/22   Joshua Lin, PA-C  HYDROcodone -acetaminophen  (NORCO/VICODIN) 5-325 MG tablet Take 1 tablet by mouth every 6 (six) hours as needed for moderate pain or severe pain. 11/06/22   Joshua Lin, PA-C  levothyroxine  (SYNTHROID ) 75 MCG tablet Take 75 mcg by mouth daily before breakfast. 10/02/22 10/02/23  [provider]  losartan  (COZAAR ) 100 MG tablet Take 100 mg by mouth daily.    [provider]  methocarbamol  (ROBAXIN ) 500 MG tablet Take 1 tablet (500 mg total) by mouth every 8 (eight) hours as needed for muscle spasms. 11/06/22   Joshua Lin, PA-C  potassium chloride  (K-DUR) 10 MEQ tablet Take 10 mEq by mouth daily.  02/08/15   [provider]  rosuvastatin  (CRESTOR ) 5 MG tablet Take 5 mg by mouth daily. 10/10/22   [provider]     Critical care time: 50 minutes  Inge Lecher, AGACNP-BC [facility] Prefer epic messenger for cross cover needs If after hours, please call E-link

## 2024-08-19 NOTE — Consult Note (Addendum)
 PHARMACY CONSULT NOTE  Pharmacy Consult for Electrolyte Monitoring and Replacement   Recent Labs: Potassium (mmol/L)  Date Value  08/19/2024 2.5 (LL)   Magnesium  (mg/dL)  Date Value  88/85/7974 2.1   Calcium  (mg/dL)  Date Value  88/85/7974 9.4   Albumin (g/dL)  Date Value  88/85/7974 4.3   Sodium (mmol/L)  Date Value  08/19/2024 115 (LL)     Assessment: 78 y/o female with PMH of HTN, HLD,pericardial effusion presenting with persistent nausea and vomiting and severe hyponatremia and hypokalemia. Pharmacy has been consulted to manage electrolytes. --K+ 2.5 --Na+ 115, spoke with NP Shellia, will monitor Na+ and neuro checks. Patient not symptomatic  Goal of Therapy:  Electrolytes WNL  Plan:  Patient received K+10 meq IV  x2 doses.  Will give K + 10 meq IV x5 doses.  Will order BMP in  2 hours after last dose and monitor electrolytes with AM labs.  Annabella LOISE Banks ,PharmD Clinical Pharmacist 08/19/2024 5:12 PM

## 2024-08-19 NOTE — ED Triage Notes (Signed)
 Patient states she has been feeling nauseous and dizzy for one week; went to doctor today and was sent over for Hyponatremia and Hypokalemia.

## 2024-08-19 NOTE — ED Notes (Signed)
 [employer] changed due to pt critical lab result

## 2024-08-20 ENCOUNTER — Other Ambulatory Visit: Payer: Self-pay

## 2024-08-20 LAB — CBC
HCT: 30.1 % — ABNORMAL LOW (ref 36.0–46.0)
Hemoglobin: 11 g/dL — ABNORMAL LOW (ref 12.0–15.0)
MCH: 28.4 pg (ref 26.0–34.0)
MCHC: 36.5 g/dL — ABNORMAL HIGH (ref 30.0–36.0)
MCV: 77.8 fL — ABNORMAL LOW (ref 80.0–100.0)
Platelets: 288 K/uL (ref 150–400)
RBC: 3.87 MIL/uL (ref 3.87–5.11)
RDW: 13.6 % (ref 11.5–15.5)
WBC: 7 K/uL (ref 4.0–10.5)
nRBC: 0 % (ref 0.0–0.2)

## 2024-08-20 LAB — RENAL FUNCTION PANEL
Albumin: 3.5 g/dL (ref 3.5–5.0)
Albumin: 3.6 g/dL (ref 3.5–5.0)
Anion gap: 7 (ref 5–15)
Anion gap: 8 (ref 5–15)
BUN: 5 mg/dL — ABNORMAL LOW (ref 8–23)
BUN: <5 mg/dL — ABNORMAL LOW (ref 8–23)
CO2: 30 mmol/L (ref 22–32)
CO2: 25 mmol/L (ref 22–32)
Calcium: 8.2 mg/dL — ABNORMAL LOW (ref 8.9–10.3)
Calcium: 8.1 mg/dL — ABNORMAL LOW (ref 8.9–10.3)
Chloride: 85 mmol/L — ABNORMAL LOW (ref 98–111)
Chloride: 87 mmol/L — ABNORMAL LOW (ref 98–111)
Creatinine, Ser: 0.44 mg/dL (ref 0.44–1.00)
Creatinine, Ser: 0.5 mg/dL (ref 0.44–1.00)
GFR, Estimated: >60 mL/min (ref >60)
GFR, Estimated: >60 mL/min (ref >60)
Glucose, Bld: 101 mg/dL — ABNORMAL HIGH (ref 70–99)
Glucose, Bld: 183 mg/dL — ABNORMAL HIGH (ref 70–99)
Phosphorus: 2 mg/dL — ABNORMAL LOW (ref 2.5–4.6)
Phosphorus: 2 mg/dL — ABNORMAL LOW (ref 2.5–4.6)
Potassium: 3 mmol/L — ABNORMAL LOW (ref 3.5–5.1)
Potassium: 4.2 mmol/L (ref 3.5–5.1)
Sodium: 122 mmol/L — ABNORMAL LOW (ref 135–145)
Sodium: 120 mmol/L — ABNORMAL LOW (ref 135–145)

## 2024-08-20 LAB — SODIUM
Sodium: 120 mmol/L — ABNORMAL LOW (ref 135–145)
Sodium: 121 mmol/L — ABNORMAL LOW (ref 135–145)
Sodium: 121 mmol/L — ABNORMAL LOW (ref 135–145)
Sodium: 123 mmol/L — ABNORMAL LOW (ref 135–145)

## 2024-08-20 LAB — BASIC METABOLIC PANEL WITH GFR
Anion gap: 6 (ref 5–15)
BUN: 7 mg/dL — ABNORMAL LOW (ref 8–23)
CO2: 31 mmol/L (ref 22–32)
Calcium: 8.1 mg/dL — ABNORMAL LOW (ref 8.9–10.3)
Chloride: 80 mmol/L — ABNORMAL LOW (ref 98–111)
Creatinine, Ser: 0.47 mg/dL (ref 0.44–1.00)
GFR, Estimated: >60 mL/min (ref >60)
Glucose, Bld: 102 mg/dL — ABNORMAL HIGH (ref 70–99)
Potassium: 2.3 mmol/L — CL (ref 3.5–5.1)
Sodium: 117 mmol/L — CL (ref 135–145)

## 2024-08-20 LAB — MAGNESIUM: Magnesium: 2.5 mg/dL — ABNORMAL HIGH (ref 1.7–2.4)

## 2024-08-20 LAB — GLUCOSE, CAPILLARY
Glucose-Capillary: 66 mg/dL — ABNORMAL LOW (ref 70–99)
Glucose-Capillary: 64 mg/dL — ABNORMAL LOW (ref 70–99)
Glucose-Capillary: 98 mg/dL (ref 70–99)

## 2024-08-20 LAB — CREATININE, URINE, RANDOM: Creatinine, Urine: 48 mg/dL

## 2024-08-20 MED ORDER — POTASSIUM CHLORIDE 20 MEQ PO PACK
80.0000 meq | PACK | Freq: Once | ORAL | Status: AC
  Administered 2024-08-20: 80 meq via ORAL
  Filled 2024-08-20: qty 4

## 2024-08-20 MED ORDER — MAGNESIUM SULFATE 2 GM/50ML IV SOLN
2.0000 g | Freq: Once | INTRAVENOUS | Status: AC
  Administered 2024-08-20: 2 g via INTRAVENOUS
  Filled 2024-08-20: qty 50

## 2024-08-20 MED ORDER — ACETAMINOPHEN 10 MG/ML IV SOLN
1000.0000 mg | Freq: Four times a day (QID) | INTRAVENOUS | Status: AC | PRN
  Administered 2024-08-20: 1000 mg via INTRAVENOUS
  Filled 2024-08-20: qty 100

## 2024-08-20 MED ORDER — DEXTROSE 5 % IV BOLUS
500.0000 mL | Freq: Once | INTRAVENOUS | Status: AC
  Administered 2024-08-20: 500 mL via INTRAVENOUS

## 2024-08-20 MED ORDER — SODIUM PHOSPHATES 45 MMOLE/15ML IV SOLN
15.0000 mmol | Freq: Once | INTRAVENOUS | Status: AC
  Administered 2024-08-20: 15 mmol via INTRAVENOUS
  Filled 2024-08-20: qty 5

## 2024-08-20 MED ORDER — SODIUM PHOSPHATES 45 MMOLE/15ML IV SOLN
30.0000 mmol | Freq: Once | INTRAVENOUS | Status: DC
  Filled 2024-08-20: qty 10

## 2024-08-20 MED ORDER — DEXTROSE 5 % IV BOLUS
500.0000 mL | Freq: Once | INTRAVENOUS | Status: AC
  Administered 2024-08-20: 500 mL via INTRAVENOUS
  Filled 2024-08-20: qty 500

## 2024-08-20 MED ORDER — POTASSIUM CHLORIDE 10 MEQ/100ML IV SOLN
10.0000 meq | INTRAVENOUS | Status: DC
  Administered 2024-08-20: 10 meq via INTRAVENOUS
  Filled 2024-08-20 (×2): qty 100

## 2024-08-20 MED ORDER — POTASSIUM CHLORIDE 10 MEQ/100ML IV SOLN: 10.0000 meq | INTRAVENOUS | Status: DC

## 2024-08-20 MED ORDER — POTASSIUM CHLORIDE 10 MEQ/100ML IV SOLN
10.0000 meq | INTRAVENOUS | Status: DC
  Administered 2024-08-20 (×5): 10 meq via INTRAVENOUS
  Filled 2024-08-20 (×4): qty 100

## 2024-08-20 MED ORDER — POTASSIUM CHLORIDE 20 MEQ PO PACK
40.0000 meq | PACK | ORAL | Status: AC
  Administered 2024-08-20 (×2): 40 meq via ORAL
  Filled 2024-08-20 (×2): qty 2

## 2024-08-20 MED ORDER — K PHOS MONO-SOD PHOS DI & MONO 155-852-130 MG PO TABS
250.0000 mg | ORAL_TABLET | Freq: Four times a day (QID) | ORAL | Status: AC
  Administered 2024-08-20 – 2024-08-21 (×4): 250 mg via ORAL
  Filled 2024-08-20 (×6): qty 1

## 2024-08-20 NOTE — ED Notes (Signed)
Pt has breakfast tray at this time.

## 2024-08-20 NOTE — Plan of Care (Signed)
  Problem: Health Behavior/Discharge Planning: Goal: Ability to manage health-related needs will improve Outcome: Progressing   Problem: Clinical Measurements: Goal: Ability to maintain clinical measurements within normal limits will improve Outcome: Progressing Goal: Will remain free from infection Outcome: Progressing Goal: Diagnostic test results will improve Outcome: Progressing   Problem: Coping: Goal: Level of anxiety will decrease Outcome: Progressing   Problem: Elimination: Goal: Will not experience complications related to bowel motility Outcome: Progressing   Problem: Safety: Goal: Ability to remain free from injury will improve Outcome: Progressing   Problem: Skin Integrity: Goal: Risk for impaired skin integrity will decrease Outcome: Progressing

## 2024-08-20 NOTE — Consult Note (Signed)
 PHARMACY CONSULT NOTE  Pharmacy Consult for Electrolyte Monitoring and Replacement   Recent Labs: Potassium (mmol/L)  Date Value  08/20/2024 3.0 (L)   Magnesium  (mg/dL)  Date Value  88/84/7974 2.5 (H)   Calcium  (mg/dL)  Date Value  88/84/7974 8.2 (L)   Albumin (g/dL)  Date Value  88/84/7974 3.5   Phosphorus (mg/dL)  Date Value  88/84/7974 2.0 (L)   Sodium (mmol/L)  Date Value  08/20/2024 122 (L)     Assessment: 78 y/o female with PMH of HTN, HLD,pericardial effusion presenting with persistent nausea and vomiting and severe hyponatremia and hypokalemia. Pharmacy has been consulted to manage electrolytes. --K+ 2.5 --Na+ 115, spoke with NP Shellia, will monitor Na+ and neuro checks. Patient not symptomatic  Goal of Therapy:  Electrolytes WNL  Plan:  Kcl 10mEq IV x 6 NaPhos 30mmol IV x 1 Will order BMP in  2 hours after last dose and monitor electrolytes with AM labs.  Inanna Telford A Tyree Vandruff ,PharmD Clinical Pharmacist 08/20/2024 7:47 AM

## 2024-08-20 NOTE — ED Notes (Signed)
 MD messaged about over correction of Na+. This RN informed MD that night shift had run NS with K+ runners. Per MD d/c Both K+ and NS. Orders placed per MD.

## 2024-08-20 NOTE — Consult Note (Signed)
 [facility] CONSULT NOTE    Date: 08/20/2024                  Patient Name:  Meagan Cain  MRN: [id number]  DOB: [birth year]  Age / Sex: 78 y.o., female         PCP: Auston Reyes BIRCH, MD                 Service Requesting Consult: ER                  Reason for Consult:      Hyponatremia       History of Present Illness: Patient is a 78 y.o. female with a PMHx of hypertension, hyperlipidemia, hypothyroidism now admitted with 2-week history of feeling dizzy, nausea and poor p.o. intake.  She was recently started on chlorthalidone.  She went to her primary medical doctor who did blood work showing a sodium of 115 and a potassium of 2.1.  She denied any chest pain, shortness of breath or mental status changes.  Medications: Outpatient medications: Medications Prior to Admission  Medication Sig Dispense Refill Last Dose/Taking   aspirin  EC 81 MG tablet Take 1 tablet (81 mg total) by mouth 2 (two) times daily. 30 tablet 11 08/19/2024   Cholecalciferol (VITAMIN D3) 2000 UNITS capsule Take 1,000 Units by mouth daily.    08/19/2024   Cyanocobalamin  (RA VITAMIN B-12 TR) 1000 MCG TBCR Take 1,000 mcg by mouth daily.    08/19/2024   diltiazem (CARDIZEM CD) 180 MG 24 hr capsule Take 180 mg by mouth daily.   08/19/2024   docusate sodium  (COLACE) 100 MG capsule Take 1 capsule (100 mg total) by mouth 2 (two) times daily. 30 capsule 0 08/19/2024   levothyroxine  (SYNTHROID ) 75 MCG tablet Take 75 mcg by mouth daily before breakfast.   08/19/2024   omeprazole (PRILOSEC) 40 MG capsule Take 40 mg by mouth daily.   08/19/2024   potassium chloride  (K-DUR) 10 MEQ tablet Take 10 mEq by mouth daily.    08/19/2024   rosuvastatin  (CRESTOR ) 5 MG tablet Take 5 mg by mouth daily.   08/18/2024   conjugated estrogens  (PREMARIN ) vaginal cream Place 1 Applicatorful vaginally 2 (two) times a week. 1 gram two times a week (Patient not taking: Reported on 08/19/2024) 42.5 g 1 Not Taking    HYDROcodone -acetaminophen  (NORCO/VICODIN) 5-325 MG tablet Take 1 tablet by mouth every 6 (six) hours as needed for moderate pain or severe pain. (Patient not taking: Reported on 08/19/2024) 30 tablet 0 Not Taking   losartan  (COZAAR ) 100 MG tablet Take 100 mg by mouth daily. (Patient not taking: Reported on 08/19/2024)   Not Taking   methocarbamol  (ROBAXIN ) 500 MG tablet Take 1 tablet (500 mg total) by mouth every 8 (eight) hours as needed for muscle spasms. (Patient not taking: Reported on 08/19/2024) 60 tablet 0 Not Taking    Discontinued Meds:   Medications Discontinued During This Encounter  Medication Reason   sodium chloride  0.9 % bolus 500 mL    ondansetron  (ZOFRAN ) injection 4 mg    sodium chloride  0.9 % bolus 500 mL    potassium chloride  10 mEq in 100 mL IVPB    potassium chloride  10 mEq in 100 mL IVPB Reorder   potassium chloride  10 mEq in 100 mL IVPB Reorder   potassium chloride  10 mEq in 100 mL IVPB    sodium phosphate  30 mmol in sodium chloride  0.9 % 250 mL infusion  potassium chloride  10 mEq in 100 mL IVPB     Current medications: Current Facility-Administered Medications  Medication Dose Route Frequency Provider Last Rate Last Admin   acetaminophen  (OFIRMEV ) IV 1,000 mg  1,000 mg Intravenous Q6H PRN Isadora Hose, MD   Stopped at 08/20/24 1012   Chlorhexidine Gluconate Cloth 2 % PADS 6 each  6 each Topical Daily Keene, Jeremiah D, NP       docusate sodium  (COLACE) capsule 100 mg  100 mg Oral BID PRN Keene, Jeremiah D, NP       heparin  injection 5,000 Units  5,000 Units Subcutaneous Q8H Keene, Jeremiah D, NP   5,000 Units at 08/20/24 9362   polyethylene glycol (MIRALAX  / GLYCOLAX ) packet 17 g  17 g Oral Daily PRN Keene, Jeremiah D, NP       potassium chloride  (KLOR-CON ) packet 40 mEq  40 mEq Oral Q4H Dgayli, Hose, MD   40 mEq at 08/20/24 1156   sodium phosphate  15 mmol in sodium chloride  0.9 % 250 mL infusion  15 mmol Intravenous Once Windle Ling A, RPH 43 mL/hr at  08/20/24 0955 15 mmol at 08/20/24 0955      Allergies: Allergies  Allergen Reactions   Furosemide Diarrhea   Ceftin [Cefuroxime Axetil]    Cephalosporins Nausea And Vomiting   Ibandronate Nausea And Vomiting   Sulfasalazine Dermatitis   Sulfa Antibiotics Rash      Past Medical History: Past Medical History:  Diagnosis Date   Arthritis    Cancer (HCC)    skin cancer   Chronic vaginitis    Cystocele    Depression    Effusion, pericardium    History of shingles    History of skin cancer    Hyperlipemia    Hypertension    Hypothyroidism    Osteoporosis    Rectocele    Thoracic compression fracture (HCC)    Urinary incontinence    Vaginal atrophy    Vitamin D deficiency      Past Surgical History: Past Surgical History:  Procedure Laterality Date   APPENDECTOMY     CHOLECYSTECTOMY  2003   COLONOSCOPY N/A 03/03/2018   Procedure: COLONOSCOPY;  Surgeon: Viktoria Lamar DASEN, MD;  Location[facility] ENDOSCOPY;  Service: Endoscopy;  Laterality: N/A;   CYSTOCELE REPAIR  07/30/2015   Procedure: ANTERIOR REPAIR (CYSTOCELE);  Surgeon: Gladis DELENA Dollar, MD;  Location: [facility] ORS;  Service: Gynecology;;   EYE SURGERY Bilateral    Cataract Extraction with IOL implants   INTRAMEDULLARY (IM) NAIL INTERTROCHANTERIC Right 11/05/2022   Procedure: INTRAMEDULLARY (IM) NAIL INTERTROCHANTERIC;  Surgeon: Leora Lynwood SAUNDERS, MD;  Location: [facility] ORS;  Service: Orthopedics;  Laterality: Right;   TONSILLECTOMY     VAGINAL HYSTERECTOMY       Family History: Family History  Problem Relation Age of Onset   Heart disease Mother    Diabetes Mother    Heart disease Father    Throat cancer Maternal Grandmother    Heart disease Brother    Diabetes Brother    Cancer Neg Hx    Breast cancer Neg Hx    Ovarian cancer Neg Hx    Colon cancer Neg Hx      Social History: Social History   Socioeconomic History   Marital status: Married    Spouse name: Not on file   Number of children: Not on  file   Years of education: Not on file   Highest education level: Not on file  Occupational History   Not  on file  Tobacco Use   Smoking status: Never   Smokeless tobacco: Never  Vaping Use   Vaping status: Never Used  Substance and Sexual Activity   Alcohol use: No    Alcohol/week: 0.0 standard drinks of alcohol   Drug use: No   Sexual activity: Yes    Partners: Female    Birth control/protection: Surgical  Other Topics Concern   Not on file  Social History Narrative   Not on file   Social Drivers of Health   Financial Resource Strain: Low Risk  (07/26/2024)   Received from [facility]   Overall Financial Resource Strain (CARDIA)    Difficulty of Paying Living Expenses: Not very hard  Food Insecurity: No Food Insecurity (07/26/2024)   Received from [facility]   Hunger Vital Sign    Within the past 12 months, you worried that your food would run out before you got the money to buy more.: Never true    Within the past 12 months, the food you bought just didn't last and you didn't have money to get more.: Never true  Transportation Needs: No Transportation Needs (07/26/2024)   Received from [facility] - Transportation    In the past 12 months, has lack of transportation kept you from medical appointments or from getting medications?: No    Lack of Transportation (Non-Medical): No  Physical Activity: Not on file  Stress: Not on file  Social Connections: Not on file  Intimate Partner Violence: Not At Risk (11/07/2022)   Humiliation, Afraid, Rape, and Kick questionnaire    Fear of Current or Ex-Partner: No    Emotionally Abused: No    Physically Abused: No    Sexually Abused: No     Review of Systems: As per HPI  Vital Signs: Blood pressure (!) 143/73, pulse 70, temperature 98.6 F (37 C), temperature source Oral, resp. rate 16, height 5' 1 (1.549 m), weight 58.3 kg, SpO2 100%.  Weight trends: Filed  Weights   08/19/24 1344 08/20/24 1218  Weight: 54.4 kg 58.3 kg    Physical Exam: Physical Exam: General:  No acute distress  Head:  Normocephalic, atraumatic. Moist oral mucosal membranes  Eyes:  Anicteric  Neck:  Supple  Lungs:   Clear to auscultation, normal effort  Heart:  S1S2 no rubs  Abdomen:   Soft, nontender, bowel sounds present  Extremities:  peripheral edema.  Neurologic:  Awake, alert, following commands  Skin:  No lesions  Access:     Lab results:  Basic Metabolic Panel: Recent Labs  Lab 08/19/24 1350 08/19/24 1651 08/20/24 0107 08/20/24 0248 08/20/24 0551 08/20/24 1102  NA 115* 115* 117* 120* 122* 121*  K 2.5*  --  2.3*  --  3.0*  --   CL 69*  --  80*  --  85*  --   CO2 33*  --  31  --  30  --   GLUCOSE 114*  --  102*  --  101*  --   BUN 9  --  7*  --  5*  --   CREATININE 0.56  --  0.47  --  0.44  --   CALCIUM  9.4  --  8.1*  --  8.2*  --   MG 2.1  --   --   --  2.5*  --   PHOS  --   --   --   --  2.0*  --  Creatinine, Ser  Date/Time Value Ref Range Status  08/20/2024 05:51 AM 0.44 0.44 - 1.00 mg/dL Final  88/84/7974 98:92 AM 0.47 0.44 - 1.00 mg/dL Final  88/85/7974 98:49 PM 0.56 0.44 - 1.00 mg/dL Final  97/97/7975 97:58 AM 0.55 0.44 - 1.00 mg/dL Final  97/98/7975 91:71 AM 0.52 0.44 - 1.00 mg/dL Final  98/68/7975 96:93 AM 0.60 0.44 - 1.00 mg/dL Final  98/69/7975 93:94 PM 0.52 0.44 - 1.00 mg/dL Final  92/85/7982 98:82 PM 0.60 0.44 - 1.00 mg/dL Final    CBC: Recent Labs  Lab 08/19/24 1350 08/20/24 0551  WBC 11.6* 7.0  HGB 13.4 11.0*  HCT 36.1 30.1*  MCV 77.0* 77.8*  PLT 328 288    Microbiology: Results for orders placed or performed during the hospital encounter of 08/19/24  MRSA Next Gen by PCR, Nasal     Status: None   Collection Time: 08/19/24  5:00 PM   Specimen: Nasal Mucosa; Nasal Swab  Result Value Ref Range Status   MRSA by PCR Next Gen NOT DETECTED NOT DETECTED Final    Comment: (NOTE) The GeneXpert MRSA Assay (FDA  approved for NASAL specimens only), is one component of a comprehensive MRSA colonization surveillance program. It is not intended to diagnose MRSA infection nor to guide or monitor treatment for MRSA infections. Test performance is not FDA approved in patients less than 46 years old. Performed at [facility], [address] [place], [place] [address]     Urinalysis: Recent Labs    08/19/24 1350  COLORURINE STRAW*  LABSPEC 1.004*  PHURINE 7.0  GLUCOSEU NEGATIVE  HGBUR NEGATIVE  BILIRUBINUR NEGATIVE  KETONESUR NEGATIVE  PROTEINUR NEGATIVE  NITRITE NEGATIVE  LEUKOCYTESUR NEGATIVE     Imaging:  DG Abd 1 View Result Date: 08/19/2024 CLINICAL DATA:  Dizziness with nausea. EXAM: ABDOMEN - 1 VIEW COMPARISON:  None Available. FINDINGS: The bowel gas pattern is normal. Radiopaque surgical clips are present within the right upper quadrant. No radio-opaque calculi or other significant radiographic abnormality are seen. A radiopaque intramedullary rod and compression screw device are seen within the proximal right femur. IMPRESSION: Nonobstructive bowel gas pattern. Electronically Signed   By: Suzen Dials M.D.   On: 08/19/2024 17:55   DG Ribs Unilateral W/Chest Right Result Date: 08/19/2024 CLINICAL DATA:  Fall with right lower lateral rib pain EXAM: RIGHT RIBS AND CHEST - 3+ VIEW COMPARISON:  Chest x-ray 11/04/2022, chest CT 05/18/2024 FINDINGS: Single view chest demonstrates normal cardiac size. No acute airspace disease, pleural effusion, or pneumothorax. Right rib series demonstrates possible acute right fourth lateral rib fracture. IMPRESSION: Possible acute right fourth lateral rib fracture. No pneumothorax. Electronically Signed   By: Luke Bun M.D.   On: 08/19/2024 15:32     Assessment & Plan:  78 y.o. female with a PMHx of hypertension, hyperlipidemia, hypothyroidism now admitted with 2-week history of feeling dizzy, nausea and poor p.o. intake.  She was  recently started on chlorthalidone.  She went to her primary medical doctor who did blood work showing a sodium of 115 and a potassium of 2.1.  She denied any chest pain, shortness of breath or mental status changes.  Principal Problem:   Hyponatremia  #1: Hyponatremia: Hyponatremia most likely secondary to thiazide diuretic intake.  Patient received normal saline and "Ringer's"  lactate on admission.  Most recent sodium was 122.  We can manage her on fluid restriction and close monitoring of her sodium.  Patient also has hyponatremia last year as per the previous  results.  #2: Hypokalemia: Continue to supplement potassium as ordered.  #3: Hypophosphatemia: Possibly secondary to thiazide diuretics and decreased p.o. intake. Supplement as needed.  Spoke to the family at bedside in detail and answered all their questions to their satisfaction.  LOS: 1 Pinkey Edman, MD [facility]. 11/15/202512:29 PM

## 2024-08-20 NOTE — Progress Notes (Signed)
 NAME:  Meagan Cain, MRN:  [id number], DOB:  23-Jan-1946, LOS: 1 ADMISSION DATE:  08/19/2024, CONSULTATION DATE:  08/19/2024 REFERRING MD:  Dr. Ernest, CHIEF COMPLAINT:  Fatigue, Nausea, Hyponatremia on outpatient labs   Brief Pt Description / Synopsis:  78 yo. Female admitted with severe Hyponatremia (Na+ 115 on presentation), suspect in setting of nausea and poor PO intake along with newly prescribed Chlorthalidone.   History of Present Illness:  Meagan Cain is a 78 y.o female with a past medical history of hypertension, hyperlipidemia, pericardial effusion, and hypothyroidism who presents to [facility] ED on 08/19/24 from her PCP office due to abnormal labs.  She reports that for the past 2 weeks she has been nauseous with poor PO intake, along with fatigue, and dizziness/lightheadedness.  She was recently switched to Chlorthalidone from Diltiazem by her Cardiologist on 07/11/2024, and reports she hasn't felt well since starting it.  She actually stopped taking the Chlorthalidone and switched back to Diltiazem earlier this week on Monday 08/15/24.  She denies chest pain, shortness of breath, abdominal pain, diarrhea, vomiting, fever, dysuria.   Given her persistent symptoms she was seen at her PCP today, and was referred directly to the ED as labs showed a sodium of 115 and potassium of 2.1.  She also endorsees a fall where she landed on her right chest, but denies hitting her head or neck, loss of consciousness, or any other injuries.  ED Course: Initial Vital Signs: Temperature 98.3 F, pulse 73, RR 18, BP 128/75, SpO2 99% on room air Significant Labs: Sodium 115, potassium 2.5, chloride 69, bicarb 33, glucose 114, WBC 11.6 UA negative for UTI Imaging Chest X-ray>>IMPRESSION: Possible acute right fourth lateral rib fracture. No pneumothorax. Medications Administered: Potassium repletion ordered   PCCM asked to admit for further workup and treatment.  Please see Significant Hospital Events  section below for full detailed hospital course.   Micro Data:  11/14: MRSA PCR>>negative  Antimicrobials:   Anti-infectives (From admission, onward)    None       Significant Hospital Events: Including procedures, antibiotic start and stop dates in addition to other pertinent events   11/14: Presents to ED following 2 weeks of fatigue, nausea, poor PO intake.  Found to be severely Hyponatremia with Na of 115.  PCCM asked to admit. Nephrology consulted.  11/15: Na 115 -> 122 over ~5 hours, start D5W to prevent overcorrection. Electrolyte repletion. No episodes of vomiting for two days  Interim History / Subjective:  As outlined above under Significant Hospital Events section  Objective   Blood pressure (!) 127/94, pulse 71, temperature 98.2 F (36.8 C), temperature source Oral, resp. rate 20, height 5' 1 (1.549 m), weight 54.4 kg, SpO2 98%.        Intake/Output Summary (Last 24 hours) at 08/20/2024 0910 Last data filed at 08/20/2024 0530 Gross per 24 hour  Intake 589.54 ml  Output --  Net 589.54 ml   Filed Weights   08/19/24 1344  Weight: 54.4 kg    Examination: General: Acutely ill appearing female, sitting in bed, on room air, in NAD HENT: Atraumatic, normocephalic, neck supple, no JVD, dry MM Lungs: Clear to auscultation throughout, even, nonlabored, normal effort Cardiovascular: RRR, s1s2, no M/R/G Abdomen: Soft, nontender, nondistended, no guarding or rebound tenderness, BS+ x4 Extremities: Generalized weakness, normal bulk and tone, no deformities, trace edema BLE Neuro: Awake and alert, oriented x4, moves all extremities to commands and purposefully, no focal deficits noted, speech clear, PERRLA GU: deferred  Resolved Hospital Problem list   Leukocytosis  Assessment & Plan:   #Severe Hyponatremia, suspect Hypotonic Hypovolemic in setting of 2 weeks of Nausea & poor PO intake along with newly prescribed Chlorthalidone  #Hypochloremia   #Hypophosphatemia #Hypokalemia -start on D5W 11/15 for rapid overcorrection of serum sodium -Monitor I&O's / urinary output -Follow BMP (Na+ q2h x2h, now q4h) -Ensure adequate renal perfusion -Avoid nephrotoxic agents as able -Replace electrolytes as indicated ~ Pharmacy following for assistance with electrolyte replacement -Goal Na+ correction of 6-8 mEq per 24 hrs (max 10 mEq) -s/p 250 LR bolus 11/14 -Serum Osmolality 302 (high), urine osmo 184 (low) and Urine Na+undectable - indicative of poor intake or volume loss - follow up U uric acid, U urea nitrogen -Nephrology consulted and yet to see, appreciate input  #Nausea, suspect due to hyponatremia  -KUB 11/14 to rule out mechanical process, non-obstructive bowel pattern  #Mild Leukocytosis, suspect reactive (resolved) -Monitor fever curve -Trend WBC's & Procalcitonin -Chest X-ray without of pneumonia, no UTI on UA, abdominal exam is benign and diarrhea reported, no obvious wounds -not started on empiric ABX pending cultures & sensitivities  #Acute Right 4th Rib Fracture -No evidence of PTX on chest xray -Supportive care -Pain control    Best Practice (right click and Reselect all SmartList Selections daily)   Diet/type: Regular consistency (see orders) DVT prophylaxis: prophylactic heparin   GI prophylaxis: N/A Lines: N/A Foley:  N/A Code Status:  full code Last date of multidisciplinary goals of care discussion [N/A]  11/15: Pt and her son updated at bedside on plan of care.  Labs   CBC: Recent Labs  Lab 08/19/24 1350 08/20/24 0551  WBC 11.6* 7.0  HGB 13.4 11.0*  HCT 36.1 30.1*  MCV 77.0* 77.8*  PLT 328 288    Basic Metabolic Panel: Recent Labs  Lab 08/19/24 1350 08/19/24 1651 08/20/24 0107 08/20/24 0248 08/20/24 0551  NA 115* 115* 117* 120* 122*  K 2.5*  --  2.3*  --  3.0*  CL 69*  --  80*  --  85*  CO2 33*  --  31  --  30  GLUCOSE 114*  --  102*  --  101*  BUN 9  --  7*  --  5*  CREATININE  0.56  --  0.47  --  0.44  CALCIUM  9.4  --  8.1*  --  8.2*  MG 2.1  --   --   --  2.5*  PHOS  --   --   --   --  2.0*   GFR: Estimated Creatinine Clearance: 43 mL/min (by C-G formula based on SCr of 0.44 mg/dL). Recent Labs  Lab 08/19/24 1350 08/20/24 0551  WBC 11.6* 7.0    Liver Function Tests: Recent Labs  Lab 08/19/24 1350 08/20/24 0551  AST 23  --   ALT 18  --   ALKPHOS 94  --   BILITOT 0.7  --   PROT 7.3  --   ALBUMIN 4.3 3.5   No results for input(s): LIPASE, AMYLASE in the last 168 hours. No results for input(s): AMMONIA in the last 168 hours.  ABG No results found for: PHART, PCO2ART, PO2ART, HCO3, TCO2, ACIDBASEDEF, O2SAT   Coagulation Profile: No results for input(s): INR, PROTIME in the last 168 hours.  Cardiac Enzymes: No results for input(s): CKTOTAL, CKMB, CKMBINDEX, TROPONINI in the last 168 hours.  HbA1C: No results found for: HGBA1C  CBG: No results for input(s): GLUCAP in the last 168 hours.  Review of Systems:   Positives in BOLD: Gen: Denies fever, chills, weight change, fatigue, night sweats HEENT: Denies blurred vision, double vision, hearing loss, tinnitus, sinus congestion, rhinorrhea, sore throat, neck stiffness, dysphagia PULM: Denies shortness of breath, cough, sputum production, hemoptysis, wheezing CV: Denies chest pain, edema, orthopnea, paroxysmal nocturnal dyspnea, palpitations GI: Denies abdominal pain, nausea, vomiting, diarrhea, hematochezia, melena, constipation, change in bowel habits GU: Denies dysuria, hematuria, polyuria, oliguria, urethral discharge Endocrine: Denies hot or cold intolerance, polyuria, polyphagia or appetite change Derm: Denies rash, dry skin, scaling or peeling skin change Heme: Denies easy bruising, bleeding, bleeding gums Neuro: Denies headache, numbness, weakness, slurred speech, loss of memory or consciousness   Past Medical History:  She,  has a past medical  history of Arthritis, Cancer (HCC), Chronic vaginitis, Cystocele, Depression, Effusion, pericardium, History of shingles, History of skin cancer, Hyperlipemia, Hypertension, Hypothyroidism, Osteoporosis, Rectocele, Thoracic compression fracture (HCC), Urinary incontinence, Vaginal atrophy, and Vitamin D deficiency.   Surgical History:   Past Surgical History:  Procedure Laterality Date   APPENDECTOMY     CHOLECYSTECTOMY  2003   COLONOSCOPY N/A 03/03/2018   Procedure: COLONOSCOPY;  Surgeon: Viktoria Lamar DASEN, MD;  Location[facility] ENDOSCOPY;  Service: Endoscopy;  Laterality: N/A;   CYSTOCELE REPAIR  07/30/2015   Procedure: ANTERIOR REPAIR (CYSTOCELE);  Surgeon: Gladis DELENA Dollar, MD;  Location: [facility] ORS;  Service: Gynecology;;   EYE SURGERY Bilateral    Cataract Extraction with IOL implants   INTRAMEDULLARY (IM) NAIL INTERTROCHANTERIC Right 11/05/2022   Procedure: INTRAMEDULLARY (IM) NAIL INTERTROCHANTERIC;  Surgeon: Leora Lynwood SAUNDERS, MD;  Location: [facility] ORS;  Service: Orthopedics;  Laterality: Right;   TONSILLECTOMY     VAGINAL HYSTERECTOMY       Social History:   reports that she has never smoked. She has never used smokeless tobacco. She reports that she does not drink alcohol and does not use drugs.   Family History:  Her family history includes Diabetes in her brother and mother; Heart disease in her brother, father, and mother; Throat cancer in her maternal grandmother. There is no history of Cancer, Breast cancer, Ovarian cancer, or Colon cancer.   Allergies Allergies  Allergen Reactions   Furosemide Diarrhea   Ceftin [Cefuroxime Axetil]    Cephalosporins Nausea And Vomiting   Ibandronate Nausea And Vomiting   Sulfasalazine Dermatitis   Sulfa Antibiotics Rash     Home Medications  Prior to Admission medications   Medication Sig Start Date End Date Taking? Authorizing Provider  aspirin  EC 81 MG tablet Take 1 tablet (81 mg total) by mouth 2 (two) times daily. 11/06/22   Joshua Lin, PA-C  Cholecalciferol (VITAMIN D3) 2000 UNITS capsule Take 1,000 Units by mouth daily.     [provider]  conjugated estrogens  (PREMARIN ) vaginal cream Place 1 Applicatorful vaginally 2 (two) times a week. 1 gram two times a week 02/18/16   Defrancesco, Gladis DELENA, MD  Cyanocobalamin  (RA VITAMIN B-12 TR) 1000 MCG TBCR Take 1,000 mcg by mouth daily.     [provider]  docusate sodium  (COLACE) 100 MG capsule Take 1 capsule (100 mg total) by mouth 2 (two) times daily. 11/06/22   Joshua Lin, PA-C  HYDROcodone -acetaminophen  (NORCO/VICODIN) 5-325 MG tablet Take 1 tablet by mouth every 6 (six) hours as needed for moderate pain or severe pain. 11/06/22   Joshua Lin, PA-C  levothyroxine  (SYNTHROID ) 75 MCG tablet Take 75 mcg by mouth daily before breakfast. 10/02/22 10/02/23  [provider]  losartan  (COZAAR ) 100  MG tablet Take 100 mg by mouth daily.    [provider]  methocarbamol  (ROBAXIN ) 500 MG tablet Take 1 tablet (500 mg total) by mouth every 8 (eight) hours as needed for muscle spasms. 11/06/22   Joshua Lin, PA-C  potassium chloride  (K-DUR) 10 MEQ tablet Take 10 mEq by mouth daily.  02/08/15   [provider]  rosuvastatin  (CRESTOR ) 5 MG tablet Take 5 mg by mouth daily. 10/10/22   [provider]     Critical care time: 40 minutes    Doryce Mcgregory, ACNP-BC Pulmonary Critical Care, [place] Phone: [phone]

## 2024-08-20 NOTE — ED Notes (Signed)
 Pt ambulatory to the bathroom with assistance from walker.

## 2024-08-20 NOTE — ED Notes (Signed)
 Na+ lab redrawn at this time and sent.

## 2024-08-20 NOTE — ED Notes (Signed)
 Pt ambulatory to the bathroom with assistance with walker. Gait is steady. Standby assist provided.

## 2024-08-21 DIAGNOSIS — E871 Hypo-osmolality and hyponatremia: Secondary | ICD-10-CM | POA: Diagnosis not present

## 2024-08-21 LAB — CBC
HCT: 32.4 % — ABNORMAL LOW (ref 36.0–46.0)
Hemoglobin: 11 g/dL — ABNORMAL LOW (ref 12.0–15.0)
MCH: 28.1 pg (ref 26.0–34.0)
MCHC: 34 g/dL (ref 30.0–36.0)
MCV: 82.9 fL (ref 80.0–100.0)
Platelets: 295 K/uL (ref 150–400)
RBC: 3.91 MIL/uL (ref 3.87–5.11)
RDW: 14.2 % (ref 11.5–15.5)
WBC: 7.7 K/uL (ref 4.0–10.5)
nRBC: 0 % (ref 0.0–0.2)

## 2024-08-21 LAB — SODIUM
Sodium: 123 mmol/L — ABNORMAL LOW (ref 135–145)
Sodium: 122 mmol/L — ABNORMAL LOW (ref 135–145)
Sodium: 124 mmol/L — ABNORMAL LOW (ref 135–145)
Sodium: 122 mmol/L — ABNORMAL LOW (ref 135–145)
Sodium: 122 mmol/L — ABNORMAL LOW (ref 135–145)

## 2024-08-21 LAB — MAGNESIUM: Magnesium: 2 mg/dL (ref 1.7–2.4)

## 2024-08-21 LAB — RENAL FUNCTION PANEL
Albumin: 3.3 g/dL — ABNORMAL LOW (ref 3.5–5.0)
Anion gap: 10 (ref 5–15)
BUN: 5 mg/dL — ABNORMAL LOW (ref 8–23)
CO2: 26 mmol/L (ref 22–32)
Calcium: 8.2 mg/dL — ABNORMAL LOW (ref 8.9–10.3)
Chloride: 87 mmol/L — ABNORMAL LOW (ref 98–111)
Creatinine, Ser: 0.49 mg/dL (ref 0.44–1.00)
GFR, Estimated: >60 mL/min (ref >60)
Glucose, Bld: 95 mg/dL (ref 70–99)
Phosphorus: 2.5 mg/dL (ref 2.5–4.6)
Potassium: 3.9 mmol/L (ref 3.5–5.1)
Sodium: 123 mmol/L — ABNORMAL LOW (ref 135–145)

## 2024-08-21 LAB — URIC ACID, RANDOM URINE: Uric Acid, Urine: 25.4 mg/dL

## 2024-08-21 LAB — UREA NITROGEN, URINE: Urea Nitrogen, Ur: 290 mg/dL

## 2024-08-21 MED ORDER — SODIUM CHLORIDE 1 G PO TABS
1.0000 g | ORAL_TABLET | Freq: Two times a day (BID) | ORAL | Status: DC
  Administered 2024-08-21 – 2024-08-24 (×6): 1 g via ORAL
  Filled 2024-08-21 (×6): qty 1

## 2024-08-21 MED ORDER — ONDANSETRON HCL 4 MG/2ML IJ SOLN: 4.0000 mg | Freq: Four times a day (QID) | INTRAMUSCULAR | Status: DC | PRN

## 2024-08-21 MED ORDER — LEVOTHYROXINE SODIUM 50 MCG PO TABS
75.0000 ug | ORAL_TABLET | Freq: Every day | ORAL | Status: DC
  Administered 2024-08-22 – 2024-08-24 (×3): 75 ug via ORAL
  Filled 2024-08-21 (×3): qty 1

## 2024-08-21 MED ORDER — ACETAMINOPHEN 325 MG PO TABS
650.0000 mg | ORAL_TABLET | ORAL | Status: DC | PRN
  Administered 2024-08-21 – 2024-08-23 (×4): 650 mg via ORAL
  Filled 2024-08-21 (×4): qty 2

## 2024-08-21 MED ORDER — LIDOCAINE 5 % EX PTCH
1.0000 | MEDICATED_PATCH | CUTANEOUS | Status: DC
  Administered 2024-08-21 – 2024-08-23 (×3): 1 via TRANSDERMAL
  Filled 2024-08-21 (×3): qty 1

## 2024-08-21 MED ORDER — CHLORHEXIDINE GLUCONATE CLOTH 2 % EX PADS
6.0000 | MEDICATED_PAD | Freq: Every day | CUTANEOUS | Status: DC
  Administered 2024-08-22 – 2024-08-23 (×2): 6 via TOPICAL

## 2024-08-21 NOTE — Progress Notes (Signed)
 Pt transfer to room 132. Report given Kristyn RN.

## 2024-08-21 NOTE — Consult Note (Signed)
 PHARMACY CONSULT NOTE  Pharmacy Consult for Electrolyte Monitoring and Replacement   Recent Labs: Potassium (mmol/L)  Date Value  08/21/2024 3.9   Magnesium  (mg/dL)  Date Value  88/83/7974 2.0   Calcium  (mg/dL)  Date Value  88/83/7974 8.2 (L)   Albumin (g/dL)  Date Value  88/83/7974 3.3 (L)   Phosphorus (mg/dL)  Date Value  88/83/7974 2.5   Sodium (mmol/L)  Date Value  08/21/2024 123 (L)   Correct Ca: 8.76  Assessment: 78 y/o female with PMH of HTN, HLD,pericardial effusion presenting with persistent nausea and vomiting and severe hyponatremia and hypokalemia. Pharmacy has been consulted to manage electrolytes. ICU team monitoring Na+ and neuro checks. Patient not symptomatic.  Na: 117>>120>>123   Goal of Therapy:  Electrolytes WNL  Plan:  No additional replacement needed at this time.  Continue to monitor electrolytes with AM labs.  Estill CHRISTELLA Lutes, PharmD, BCPS Clinical Pharmacist 08/21/2024 7:32 AM

## 2024-08-21 NOTE — Progress Notes (Deleted)
 Per Dr. Claudene okay for pt to get up and ambulate with supervision.

## 2024-08-21 NOTE — Progress Notes (Signed)
 PROGRESS NOTE    Meagan Cain  FMW:[id number] DOB: 1946-07-27 DOA: 08/19/2024 PCP: Auston Reyes BIRCH, MD    Assessment & Plan:   Principal Problem:   Hyponatremia  Assessment and Plan: severe hyponatremia: likely hypotonic hypovolemic in setting of poor po intake and chlorthalidone use. S/p D5W for rapid overcorrection of sodium. Will start NaCl tabs as per nephro. Na level is labile. Nephro following and recs apprec  Hypochloremia: trending up slowly. Will continue to monitor   Hypophosphatemia: WNL today   Hypokalemia: WNL today   Nausea: likely secondary to hyponatremia. Zofran  prn    Leukocytosis: resolved   Right 4th rib fracture: continue w/ supportive care. Encourage incentive spirometry         DVT prophylaxis: lovenox  Code Status: full  Family Communication: discussed pt's care w/ pt's Disposition Plan: depends on PT/OT recs (not consulted yet)  Level of care: Telemetry  Status is: Inpatient Remains inpatient appropriate because: severity of illness    Consultants:  Nephro   Procedures:   Antimicrobials:    Subjective: Pt c/o fatigue   Objective: Vitals:   08/21/24 0400 08/21/24 0407 08/21/24 0653 08/21/24 0800  BP: 116/65  (!) 121/57 124/75  Pulse: 61  64   Resp: 18  17 (!) 27  Temp:    98.9 F (37.2 C)  TempSrc:    Oral  SpO2: 97%  97% 96%  Weight:  63.6 kg    Height:        Intake/Output Summary (Last 24 hours) at 08/21/2024 0926 Last data filed at 08/21/2024 9178 Gross per 24 hour  Intake 964 ml  Output 2000 ml  Net -1036 ml   Filed Weights   08/19/24 1344 08/20/24 1218 08/21/24 0407  Weight: 54.4 kg 58.3 kg 63.6 kg    Examination:  General exam: Appears calm and comfortable  Respiratory system: Clear to auscultation. Respiratory effort normal. Cardiovascular system: S1 & S2+. No  rubs, gallops or clicks.  Gastrointestinal system: Abdomen is nondistended, soft and nontender. Normal bowel sounds heard. Central  nervous system: Alert and oriented. Moves all extremities  Psychiatry: Judgement and insight appears at baseline. Flat mood and affect    Data Reviewed: I have personally reviewed following labs and imaging studies  CBC: Recent Labs  Lab 08/19/24 1350 08/20/24 0551 08/21/24 0630  WBC 11.6* 7.0 7.7  HGB 13.4 11.0* 11.0*  HCT 36.1 30.1* 32.4*  MCV 77.0* 77.8* 82.9  PLT 328 288 295   Basic Metabolic Panel: Recent Labs  Lab 08/19/24 1350 08/19/24 1651 08/20/24 0107 08/20/24 0248 08/20/24 0551 08/20/24 1102 08/20/24 1424 08/20/24 1825 08/20/24 2231 08/21/24 0225 08/21/24 0630  NA 115*   < > 117*   < > 122*   < > 120* 121* 123* 123* 123*  K 2.5*  --  2.3*  --  3.0*  --  4.2  --   --  3.9  --   CL 69*  --  80*  --  85*  --  87*  --   --  87*  --   CO2 33*  --  31  --  30  --  25  --   --  26  --   GLUCOSE 114*  --  102*  --  101*  --  183*  --   --  95  --   BUN 9  --  7*  --  5*  --  <5*  --   --  5*  --  CREATININE 0.56  --  0.47  --  0.44  --  0.50  --   --  0.49  --   CALCIUM  9.4  --  8.1*  --  8.2*  --  8.1*  --   --  8.2*  --   MG 2.1  --   --   --  2.5*  --   --   --   --   --  2.0  PHOS  --   --   --   --  2.0*  --  2.0*  --   --  2.5  --    < > = values in this interval not displayed.   GFR: Estimated Creatinine Clearance: 48.7 mL/min (by C-G formula based on SCr of 0.49 mg/dL). Liver Function Tests: Recent Labs  Lab 08/19/24 1350 08/20/24 0551 08/20/24 1424 08/21/24 0225  AST 23  --   --   --   ALT 18  --   --   --   ALKPHOS 94  --   --   --   BILITOT 0.7  --   --   --   PROT 7.3  --   --   --   ALBUMIN 4.3 3.5 3.6 3.3*   No results for input(s): LIPASE, AMYLASE in the last 168 hours. No results for input(s): AMMONIA in the last 168 hours. Coagulation Profile: No results for input(s): INR, PROTIME in the last 168 hours. Cardiac Enzymes: No results for input(s): CKTOTAL, CKMB, CKMBINDEX, TROPONINI in the last 168 hours. BNP (last  3 results) No results for input(s): PROBNP in the last 8760 hours. HbA1C: No results for input(s): HGBA1C in the last 72 hours. CBG: Recent Labs  Lab 08/20/24 1217 08/20/24 1248 08/20/24 1537  GLUCAP 66* 64* 98   Lipid Profile: No results for input(s): CHOL, HDL, LDLCALC, TRIG, CHOLHDL, LDLDIRECT in the last 72 hours. Thyroid  Function Tests: No results for input(s): TSH, T4TOTAL, FREET4, T3FREE, THYROIDAB in the last 72 hours. Anemia Panel: No results for input(s): VITAMINB12, FOLATE, FERRITIN, TIBC, IRON, RETICCTPCT in the last 72 hours. Sepsis Labs: No results for input(s): PROCALCITON, LATICACIDVEN in the last 168 hours.  Recent Results (from the past 240 hours)  MRSA Next Gen by PCR, Nasal     Status: None   Collection Time: 08/19/24  5:00 PM   Specimen: Nasal Mucosa; Nasal Swab  Result Value Ref Range Status   MRSA by PCR Next Gen NOT DETECTED NOT DETECTED Final    Comment: (NOTE) The GeneXpert MRSA Assay (FDA approved for NASAL specimens only), is one component of a comprehensive MRSA colonization surveillance program. It is not intended to diagnose MRSA infection nor to guide or monitor treatment for MRSA infections. Test performance is not FDA approved in patients less than 76 years old. Performed at [facility], [address] [place], [place] [address]          Radiology Studies: DG Abd 1 View Result Date: 08/19/2024 CLINICAL DATA:  Dizziness with nausea. EXAM: ABDOMEN - 1 VIEW COMPARISON:  None Available. FINDINGS: The bowel gas pattern is normal. Radiopaque surgical clips are present within the right upper quadrant. No radio-opaque calculi or other significant radiographic abnormality are seen. A radiopaque intramedullary rod and compression screw device are seen within the proximal right femur. IMPRESSION: Nonobstructive bowel gas pattern. Electronically Signed   By: Suzen Dials M.D.   On: 08/19/2024  17:55   DG Ribs Unilateral W/Chest Right Result Date:  08/19/2024 CLINICAL DATA:  Fall with right lower lateral rib pain EXAM: RIGHT RIBS AND CHEST - 3+ VIEW COMPARISON:  Chest x-ray 11/04/2022, chest CT 05/18/2024 FINDINGS: Single view chest demonstrates normal cardiac size. No acute airspace disease, pleural effusion, or pneumothorax. Right rib series demonstrates possible acute right fourth lateral rib fracture. IMPRESSION: Possible acute right fourth lateral rib fracture. No pneumothorax. Electronically Signed   By: Luke Bun M.D.   On: 08/19/2024 15:32        Scheduled Meds:  [START ON 08/22/2024] Chlorhexidine Gluconate Cloth  6 each Topical QHS   heparin   5,000 Units Subcutaneous Q8H   phosphorus  250 mg Oral QID   Continuous Infusions:   LOS: 2 days      Anthony CHRISTELLA Pouch, MD [facility] Pager [phone]  If 7PM-7AM, please contact night-coverage [email or web address] 08/21/2024, 9:26 AM

## 2024-08-21 NOTE — Progress Notes (Signed)
 [facility]  PROGRESS NOTE   Subjective:   Patient seen in the ICU.  Feels much better.  Objective:  Vital signs: Blood pressure 137/70, pulse 93, temperature 98.9 F (37.2 C), temperature source Oral, resp. rate (!) 24, height 5' 1 (1.549 m), weight 63.6 kg, SpO2 96%.  Intake/Output Summary (Last 24 hours) at 08/21/2024 1053 Last data filed at 08/21/2024 0821 Gross per 24 hour  Intake 964 ml  Output 2000 ml  Net -1036 ml   Filed Weights   08/19/24 1344 08/20/24 1218 08/21/24 0407  Weight: 54.4 kg 58.3 kg 63.6 kg     Physical Exam: General:  No acute distress  Head:  Normocephalic, atraumatic. Moist oral mucosal membranes  Eyes:  Anicteric  Neck:  Supple  Lungs:   Clear to auscultation, normal effort  Heart:  S1S2 no rubs  Abdomen:   Soft, nontender, bowel sounds present  Extremities:  peripheral edema.  Neurologic:  Awake, alert, following commands  Skin:  No lesions  Access:     Basic Metabolic Panel: Recent Labs  Lab 08/19/24 1350 08/19/24 1651 08/20/24 0107 08/20/24 0248 08/20/24 0551 08/20/24 1102 08/20/24 1424 08/20/24 1825 08/20/24 2231 08/21/24 0225 08/21/24 0630  NA 115*   < > 117*   < > 122*   < > 120* 121* 123* 123* 123*  K 2.5*  --  2.3*  --  3.0*  --  4.2  --   --  3.9  --   CL 69*  --  80*  --  85*  --  87*  --   --  87*  --   CO2 33*  --  31  --  30  --  25  --   --  26  --   GLUCOSE 114*  --  102*  --  101*  --  183*  --   --  95  --   BUN 9  --  7*  --  5*  --  <5*  --   --  5*  --   CREATININE 0.56  --  0.47  --  0.44  --  0.50  --   --  0.49  --   CALCIUM  9.4  --  8.1*  --  8.2*  --  8.1*  --   --  8.2*  --   MG 2.1  --   --   --  2.5*  --   --   --   --   --  2.0  PHOS  --   --   --   --  2.0*  --  2.0*  --   --  2.5  --    < > = values in this interval not displayed.   GFR: Estimated Creatinine Clearance: 48.7 mL/min (by C-G formula based on SCr of 0.49 mg/dL).  Liver Function Tests: Recent Labs  Lab  08/19/24 1350 08/20/24 0551 08/20/24 1424 08/21/24 0225  AST 23  --   --   --   ALT 18  --   --   --   ALKPHOS 94  --   --   --   BILITOT 0.7  --   --   --   PROT 7.3  --   --   --   ALBUMIN 4.3 3.5 3.6 3.3*   No results for input(s): LIPASE, AMYLASE in the last 168 hours. No results for input(s): AMMONIA in the last 168 hours.  CBC: Recent Labs  Lab 08/19/24 1350 08/20/24 0551 08/21/24 0630  WBC 11.6* 7.0 7.7  HGB 13.4 11.0* 11.0*  HCT 36.1 30.1* 32.4*  MCV 77.0* 77.8* 82.9  PLT 328 288 295     HbA1C: No results found for: HGBA1C  Urinalysis: Recent Labs    08/19/24 1350  COLORURINE STRAW*  LABSPEC 1.004*  PHURINE 7.0  GLUCOSEU NEGATIVE  HGBUR NEGATIVE  BILIRUBINUR NEGATIVE  KETONESUR NEGATIVE  PROTEINUR NEGATIVE  NITRITE NEGATIVE  LEUKOCYTESUR NEGATIVE      Imaging: DG Abd 1 View Result Date: 08/19/2024 CLINICAL DATA:  Dizziness with nausea. EXAM: ABDOMEN - 1 VIEW COMPARISON:  None Available. FINDINGS: The bowel gas pattern is normal. Radiopaque surgical clips are present within the right upper quadrant. No radio-opaque calculi or other significant radiographic abnormality are seen. A radiopaque intramedullary rod and compression screw device are seen within the proximal right femur. IMPRESSION: Nonobstructive bowel gas pattern. Electronically Signed   By: Suzen Dials M.D.   On: 08/19/2024 17:55   DG Ribs Unilateral W/Chest Right Result Date: 08/19/2024 CLINICAL DATA:  Fall with right lower lateral rib pain EXAM: RIGHT RIBS AND CHEST - 3+ VIEW COMPARISON:  Chest x-ray 11/04/2022, chest CT 05/18/2024 FINDINGS: Single view chest demonstrates normal cardiac size. No acute airspace disease, pleural effusion, or pneumothorax. Right rib series demonstrates possible acute right fourth lateral rib fracture. IMPRESSION: Possible acute right fourth lateral rib fracture. No pneumothorax. Electronically Signed   By: Luke Bun M.D.   On: 08/19/2024 15:32      Medications:     [START ON 08/22/2024] Chlorhexidine Gluconate Cloth  6 each Topical QHS   heparin   5,000 Units Subcutaneous Q8H   phosphorus  250 mg Oral QID    Assessment/ Plan:     78 y.o. female with a PMHx of hypertension, hyperlipidemia, hypothyroidism now admitted with 2-week history of feeling dizzy, nausea and poor p.o. intake.  She was recently started on chlorthalidone.  She went to her primary medical doctor who did blood work showing a sodium of 115 and a potassium of 2.1.  She denied any chest pain, shortness of breath or mental status changes.   Principal Problem:   Hyponatremia   #1: Hyponatremia: Hyponatremia most likely secondary to thiazide diuretic intake.  Patient received normal saline and "Ringer's"  lactate on admission.  Most recent sodium was 123.  We can manage her on fluid restriction and close monitoring of her sodium. Patient also has hyponatremia last year as per the previous results.  We can probably start her on salt tablets at 1 g daily.  Avoid thiazide diuretics.   #2: Hypokalemia: Now improved.     #3: Hypophosphatemia: Supplemented now within normal range.      Labs and medications reviewed. Will continue to follow along with you.   LOS: 2 Pinkey Edman, MD [facility] 11/16/202510:53 AM

## 2024-08-22 DIAGNOSIS — E871 Hypo-osmolality and hyponatremia: Secondary | ICD-10-CM | POA: Diagnosis not present

## 2024-08-22 LAB — CBC
HCT: 31.6 % — ABNORMAL LOW (ref 36.0–46.0)
Hemoglobin: 10.9 g/dL — ABNORMAL LOW (ref 12.0–15.0)
MCH: 28.7 pg (ref 26.0–34.0)
MCHC: 34.5 g/dL (ref 30.0–36.0)
MCV: 83.2 fL (ref 80.0–100.0)
Platelets: 267 K/uL (ref 150–400)
RBC: 3.8 MIL/uL — ABNORMAL LOW (ref 3.87–5.11)
RDW: 14.3 % (ref 11.5–15.5)
WBC: 8.1 K/uL (ref 4.0–10.5)
nRBC: 0 % (ref 0.0–0.2)

## 2024-08-22 LAB — SODIUM
Sodium: 123 mmol/L — ABNORMAL LOW (ref 135–145)
Sodium: 125 mmol/L — ABNORMAL LOW (ref 135–145)
Sodium: 125 mmol/L — ABNORMAL LOW (ref 135–145)
Sodium: 124 mmol/L — ABNORMAL LOW (ref 135–145)
Sodium: 123 mmol/L — ABNORMAL LOW (ref 135–145)
Sodium: 125 mmol/L — ABNORMAL LOW (ref 135–145)

## 2024-08-22 LAB — RENAL FUNCTION PANEL
Albumin: 3.4 g/dL — ABNORMAL LOW (ref 3.5–5.0)
Anion gap: 8 (ref 5–15)
BUN: <5 mg/dL — ABNORMAL LOW (ref 8–23)
CO2: 27 mmol/L (ref 22–32)
Calcium: 8.4 mg/dL — ABNORMAL LOW (ref 8.9–10.3)
Chloride: 88 mmol/L — ABNORMAL LOW (ref 98–111)
Creatinine, Ser: 0.49 mg/dL (ref 0.44–1.00)
GFR, Estimated: >60 mL/min (ref >60)
Glucose, Bld: 98 mg/dL (ref 70–99)
Phosphorus: 3 mg/dL (ref 2.5–4.6)
Potassium: 3.6 mmol/L (ref 3.5–5.1)
Sodium: 123 mmol/L — ABNORMAL LOW (ref 135–145)

## 2024-08-22 LAB — MAGNESIUM: Magnesium: 1.9 mg/dL (ref 1.7–2.4)

## 2024-08-22 MED ORDER — ENSURE PLUS HIGH PROTEIN PO LIQD
237.0000 mL | Freq: Two times a day (BID) | ORAL | Status: DC
  Administered 2024-08-22 – 2024-08-23 (×2): 237 mL via ORAL

## 2024-08-22 MED ORDER — AMLODIPINE BESYLATE 10 MG PO TABS
10.0000 mg | ORAL_TABLET | Freq: Every day | ORAL | Status: DC
  Administered 2024-08-23 – 2024-08-24 (×2): 10 mg via ORAL
  Filled 2024-08-22 (×3): qty 1

## 2024-08-22 NOTE — Progress Notes (Addendum)
 [facility]  PROGRESS NOTE   Subjective:   Patient seen sitting up in bed Alert and oriented Family at bedside Ate more than half of breakfast then got nauseated   Sodium 125  Objective:  Vital signs: Blood pressure 133/72, pulse (!) 59, temperature 98 F (36.7 C), resp. rate 16, height 5' 1 (1.549 m), weight 57.9 kg, SpO2 99%.  Intake/Output Summary (Last 24 hours) at 08/22/2024 1105 Last data filed at 08/22/2024 0900 Gross per 24 hour  Intake 1080 ml  Output --  Net 1080 ml   Filed Weights   08/20/24 1218 08/21/24 0407 08/22/24 0500  Weight: 58.3 kg 63.6 kg 57.9 kg     Physical Exam: General:  No acute distress  Head:  Normocephalic, atraumatic. Moist oral mucosal membranes  Eyes:  Anicteric  Neck:  Supple  Lungs:   Clear to auscultation, normal effort  Heart:  S1S2 no rubs  Abdomen:   Soft, nontender, bowel sounds present  Extremities:  peripheral edema.  Neurologic:  Awake, alert, following commands  Skin:  No lesions  Access:     Basic Metabolic Panel: Recent Labs  Lab 08/19/24 1350 08/19/24 1651 08/20/24 0107 08/20/24 0248 08/20/24 0551 08/20/24 1102 08/20/24 1424 08/20/24 1825 08/21/24 0225 08/21/24 0630 08/21/24 1028 08/21/24 1817 08/21/24 2231 08/22/24 0210 08/22/24 0212 08/22/24 0628  NA 115*   < > 117*   < > 122*   < > 120*   < > 123* 123*   < > 122* 122* 123* 123* 125*  K 2.5*  --  2.3*  --  3.0*  --  4.2  --  3.9  --   --   --   --  3.6  --   --   CL 69*  --  80*  --  85*  --  87*  --  87*  --   --   --   --  88*  --   --   CO2 33*  --  31  --  30  --  25  --  26  --   --   --   --  27  --   --   GLUCOSE 114*  --  102*  --  101*  --  183*  --  95  --   --   --   --  98  --   --   BUN 9  --  7*  --  5*  --  <5*  --  5*  --   --   --   --  <5*  --   --   CREATININE 0.56  --  0.47  --  0.44  --  0.50  --  0.49  --   --   --   --  0.49  --   --   CALCIUM  9.4  --  8.1*  --  8.2*  --  8.1*  --  8.2*  --   --   --   --  8.4*   --   --   MG 2.1  --   --   --  2.5*  --   --   --   --  2.0  --   --   --  1.9  --   --   PHOS  --   --   --   --  2.0*  --  2.0*  --  2.5  --   --   --   --  3.0  --   --    < > = values in this interval not displayed.   GFR: Estimated Creatinine Clearance: 46.6 mL/min (by C-G formula based on SCr of 0.49 mg/dL).  Liver Function Tests: Recent Labs  Lab 08/19/24 1350 08/20/24 0551 08/20/24 1424 08/21/24 0225 08/22/24 0210  AST 23  --   --   --   --   ALT 18  --   --   --   --   ALKPHOS 94  --   --   --   --   BILITOT 0.7  --   --   --   --   PROT 7.3  --   --   --   --   ALBUMIN 4.3 3.5 3.6 3.3* 3.4*   No results for input(s): LIPASE, AMYLASE in the last 168 hours. No results for input(s): AMMONIA in the last 168 hours.  CBC: Recent Labs  Lab 08/19/24 1350 08/20/24 0551 08/21/24 0630 08/22/24 0210  WBC 11.6* 7.0 7.7 8.1  HGB 13.4 11.0* 11.0* 10.9*  HCT 36.1 30.1* 32.4* 31.6*  MCV 77.0* 77.8* 82.9 83.2  PLT 328 288 295 267     HbA1C: No results found for: HGBA1C  Urinalysis: Recent Labs    08/19/24 1350  COLORURINE STRAW*  LABSPEC 1.004*  PHURINE 7.0  GLUCOSEU NEGATIVE  HGBUR NEGATIVE  BILIRUBINUR NEGATIVE  KETONESUR NEGATIVE  PROTEINUR NEGATIVE  NITRITE NEGATIVE  LEUKOCYTESUR NEGATIVE      Imaging: No results found.    Medications:     Chlorhexidine Gluconate Cloth  6 each Topical QHS   feeding supplement  237 mL Oral BID BM   heparin   5,000 Units Subcutaneous Q8H   levothyroxine   75 mcg Oral Q0600   lidocaine   1 patch Transdermal Q24H   sodium chloride   1 g Oral BID WC    Assessment/ Plan:     78 y.o. female with a PMHx of hypertension, hyperlipidemia, hypothyroidism now admitted with 2-week history of feeling dizzy, nausea and poor p.o. intake.  She was recently started on chlorthalidone.  She went to her primary medical doctor who did blood work showing a sodium of 115 and a potassium of 2.1.  She denied any chest pain,  shortness of breath or mental status changes.   Principal Problem:   Hyponatremia   #1: Hyponatremia: Hyponatremia most likely secondary to thiazide diuretic and decreased solute intake.  Patient received normal saline and "Ringer's"  lactate on admission.    Sodium 125 today, appetite has improved. Setback with breakfast this morning. Continue salt tabs and fluid restriction. Will order Ensure twice a day for supplementation. Ordering TSH, SPEP/UPEP in am.   #2: Hypokalemia: Corrected   #3: Hypophosphatemia: Corrected   LOS: 3 [facility] 11/17/202511:05 AM

## 2024-08-22 NOTE — Plan of Care (Signed)
   Problem: Education: Goal: Knowledge of General Education information will improve Description Including pain rating scale, medication(s)/side effects and non-pharmacologic comfort measures Outcome: Progressing   Problem: Health Behavior/Discharge Planning: Goal: Ability to manage health-related needs will improve Outcome: Progressing

## 2024-08-22 NOTE — Evaluation (Signed)
 Occupational Therapy Evaluation Patient Details Name: Meagan Cain MRN: [id number] DOB: 1945/12/16 Today's Date: 08/22/2024   History of Present Illness   Pt is a 78 y/o F admitted on 08/19/24 after presenting with c/o nausea & dizziness, referred to hospital from outpatient MD 2/2 hyponatremia & hypokalemia. Pt with fall prior to admission. Imaging showed Possible acute right fourth lateral rib fracture. PMH: arthritis, skin CA, cystocele, depression, shingles, HTN, hypothyroidism, osteoporosis, rectocele, thoracic compression fx, urinary incontinence, vaginal atrophy, vit D deficiency     Clinical Impressions Upon entering the room. Pt supine in bed and agreeable to OT evaluation. Pt reports living at home with husband and being Mod I at baseline. She endorses being Ind with self care and IADL tasks. Pt performing bed mobility without assistance. She stands and ambulates with RW to sink for oral hygiene in standing. Pt then ambulates 75' in hallway with RW and returns to room. Pt returns to bed without assistance. Pt does endorse rib pain with transitions in and out of bed but able to do so without assistance. At this time pt does not need skilled acute OT intervention. OT to complete orders.       Functional Status Assessment   Patient has not had a recent decline in their functional status     Equipment Recommendations   None recommended by OT      Precautions/Restrictions   Precautions Precautions: Fall     Mobility Bed Mobility Overal bed mobility: Modified Independent                  Transfers Overall transfer level: Needs assistance Equipment used: Rolling walker (2 wheels) Transfers: Sit to/from Stand Sit to Stand: Modified independent (Device/Increase time)                  Balance Overall balance assessment: Needs assistance   Sitting balance-Leahy Scale: Good     Standing balance support: Bilateral upper extremity supported, Reliant  on assistive device for balance                               ADL either performed or assessed with clinical judgement   ADL Overall ADL's : Modified independent                                             Vision Patient Visual Report: No change from baseline              Pertinent Vitals/Pain Pain Assessment Pain Assessment: Faces Faces Pain Scale: Hurts little more Pain Location: R sided rib pain Pain Descriptors / Indicators: Discomfort Pain Intervention(s): Monitored during session     Extremity/Trunk Assessment Upper Extremity Assessment Upper Extremity Assessment: Overall WFL for tasks assessed   Lower Extremity Assessment Lower Extremity Assessment: Overall WFL for tasks assessed       Communication Communication Communication: No apparent difficulties   Cognition Arousal: Alert Behavior During Therapy: WFL for tasks assessed/performed Cognition: No apparent impairments                               Following commands: Intact       Cueing  General Comments      Educated pt on use of incentive spirometer with pt return demonstrating.  Exercises     Shoulder Instructions      Home Living Family/patient expects to be discharged to:: Private residence Living Arrangements: Spouse/significant other Available Help at Discharge: Family Type of Home: House Home Access: Stairs to enter Secretary/administrator of Steps: 3 Entrance Stairs-Rails: Right Home Layout: Laundry or work area in basement;Able to live on main level with bedroom/bathroom     Bathroom Shower/Tub: Chief Strategy Officer: Handicapped height     Home Equipment: Rollator (4 wheels);Cane - single point          Prior Functioning/Environment Prior Level of Function : Driving             Mobility Comments: ambulatory with rollator in the home, SPC outside of the home, reports 1 fall prior to admission ADLs Comments:  independent, reports she manages her own meds, cooks, cleans            OT Goals(Current goals can be found in the care plan section)   Acute Rehab OT Goals Patient Stated Goal: to go home OT Goal Formulation: With patient Time For Goal Achievement: 08/22/24 Potential to Achieve Goals: Fair   AM-PAC OT 6 Clicks Daily Activity     Outcome Measure Help from another person eating meals?: None Help from another person taking care of personal grooming?: None Help from another person toileting, which includes using toliet, bedpan, or urinal?: None Help from another person bathing (including washing, rinsing, drying)?: None Help from another person to put on and taking off regular upper body clothing?: None Help from another person to put on and taking off regular lower body clothing?: None 6 Click Score: 24   End of Session Equipment Utilized During Treatment: Rolling walker (2 wheels) Nurse Communication: Mobility status  Activity Tolerance: Patient tolerated treatment well Patient left: in bed;with call bell/phone within reach;with bed alarm set                   Time: 1110-1132 OT Time Calculation (min): 22 min Charges:  OT General Charges $OT Visit: 1 Visit OT Evaluation $OT Eval Low Complexity: 1 Low OT Treatments $Self Care/Home Management : 8-22 mins  Izetta Claude, MS, OTR/L , CBIS ascom [phone]  08/22/24, 2:37 PM

## 2024-08-22 NOTE — Care Management Important Message (Signed)
 Important Message  Patient Details  Name: Meagan Cain MRN: [id number] Date of Birth: Feb 03, 1946   Important Message Given:  Yes - Medicare IM     [name] 08/22/2024, 5:28 PM

## 2024-08-22 NOTE — Progress Notes (Signed)
 PROGRESS NOTE    Meagan Cain  FMW:[id number] DOB: Oct 25, 1945 DOA: 08/19/2024 PCP: Auston Reyes BIRCH, MD    Assessment & Plan:   Principal Problem:   Hyponatremia  Assessment and Plan: Severe hyponatremia: likely hypotonic hypovolemic in setting of poor po intake and chlorthalidone use. S/p D5W for rapid overcorrection of sodium. Continue on NaCl tabs as per nephro. Na level is trending up today. Nephro following and recs apprec   HTN: home dose of chlorthalidone was d/c secondary to severe hyponatremia. Home dose of diltiazem was d/c by PCP secondary to dry mouth. Started on amlodipine  Hypochloremia: slowly trending up. Will continue to monitor   Hypophosphatemia: WNL today   Hypokalemia: WNL today   Nausea: likely secondary to hyponatremia. Zofran  prn    Leukocytosis: resolved   Right 4th rib fracture: continue w/ supportive care & lidocaine  patch. Encourage incentive spirometry         DVT prophylaxis: lovenox  Code Status: full  Family Communication: discussed pt's care w/ pt's son at bedside and answered his questions  Disposition Plan: likely d/c back home  Level of care: Telemetry  Status is: Inpatient Remains inpatient appropriate because: severity of illness, Na 125    Consultants:  Nephro   Procedures:   Antimicrobials:    Subjective: Pt c/o malaise  Objective: Vitals:   08/21/24 2046 08/22/24 0500 08/22/24 0547 08/22/24 0729  BP: 115/64  135/77 133/72  Pulse: 72  63 (!) 59  Resp: 16  15 16   Temp: 99.1 F (37.3 C)  (!) 97.4 F (36.3 C) 98 F (36.7 C)  TempSrc:      SpO2: 98%  100% 99%  Weight:  57.9 kg    Height:        Intake/Output Summary (Last 24 hours) at 08/22/2024 0845 Last data filed at 08/21/2024 1823 Gross per 24 hour  Intake 840 ml  Output --  Net 840 ml   Filed Weights   08/20/24 1218 08/21/24 0407 08/22/24 0500  Weight: 58.3 kg 63.6 kg 57.9 kg    Examination:  General exam: appears comfortable   Respiratory system: clear breath sounds b/l  Cardiovascular system: S1/S2+. No rubs or clicks  Gastrointestinal system: abd is soft, NT, ND & hypoactive bowel sounds. Central nervous system: alert & oriented. Moves all extremities  Psychiatry: Judgement and insight appears at baseline. Appropriate mood and affect    Data Reviewed: I have personally reviewed following labs and imaging studies  CBC: Recent Labs  Lab 08/19/24 1350 08/20/24 0551 08/21/24 0630 08/22/24 0210  WBC 11.6* 7.0 7.7 8.1  HGB 13.4 11.0* 11.0* 10.9*  HCT 36.1 30.1* 32.4* 31.6*  MCV 77.0* 77.8* 82.9 83.2  PLT 328 288 295 267   Basic Metabolic Panel: Recent Labs  Lab 08/19/24 1350 08/19/24 1651 08/20/24 0107 08/20/24 0248 08/20/24 0551 08/20/24 1102 08/20/24 1424 08/20/24 1825 08/21/24 0225 08/21/24 0630 08/21/24 1028 08/21/24 1817 08/21/24 2231 08/22/24 0210 08/22/24 0212 08/22/24 0628  NA 115*   < > 117*   < > 122*   < > 120*   < > 123* 123*   < > 122* 122* 123* 123* 125*  K 2.5*  --  2.3*  --  3.0*  --  4.2  --  3.9  --   --   --   --  3.6  --   --   CL 69*  --  80*  --  85*  --  87*  --  87*  --   --   --   --  88*  --   --   CO2 33*  --  31  --  30  --  25  --  26  --   --   --   --  27  --   --   GLUCOSE 114*  --  102*  --  101*  --  183*  --  95  --   --   --   --  98  --   --   BUN 9  --  7*  --  5*  --  <5*  --  5*  --   --   --   --  <5*  --   --   CREATININE 0.56  --  0.47  --  0.44  --  0.50  --  0.49  --   --   --   --  0.49  --   --   CALCIUM  9.4  --  8.1*  --  8.2*  --  8.1*  --  8.2*  --   --   --   --  8.4*  --   --   MG 2.1  --   --   --  2.5*  --   --   --   --  2.0  --   --   --  1.9  --   --   PHOS  --   --   --   --  2.0*  --  2.0*  --  2.5  --   --   --   --  3.0  --   --    < > = values in this interval not displayed.   GFR: Estimated Creatinine Clearance: 46.6 mL/min (by C-G formula based on SCr of 0.49 mg/dL). Liver Function Tests: Recent Labs  Lab 08/19/24 1350  08/20/24 0551 08/20/24 1424 08/21/24 0225 08/22/24 0210  AST 23  --   --   --   --   ALT 18  --   --   --   --   ALKPHOS 94  --   --   --   --   BILITOT 0.7  --   --   --   --   PROT 7.3  --   --   --   --   ALBUMIN 4.3 3.5 3.6 3.3* 3.4*   No results for input(s): LIPASE, AMYLASE in the last 168 hours. No results for input(s): AMMONIA in the last 168 hours. Coagulation Profile: No results for input(s): INR, PROTIME in the last 168 hours. Cardiac Enzymes: No results for input(s): CKTOTAL, CKMB, CKMBINDEX, TROPONINI in the last 168 hours. BNP (last 3 results) No results for input(s): PROBNP in the last 8760 hours. HbA1C: No results for input(s): HGBA1C in the last 72 hours. CBG: Recent Labs  Lab 08/20/24 1217 08/20/24 1248 08/20/24 1537  GLUCAP 66* 64* 98   Lipid Profile: No results for input(s): CHOL, HDL, LDLCALC, TRIG, CHOLHDL, LDLDIRECT in the last 72 hours. Thyroid  Function Tests: No results for input(s): TSH, T4TOTAL, FREET4, T3FREE, THYROIDAB in the last 72 hours. Anemia Panel: No results for input(s): VITAMINB12, FOLATE, FERRITIN, TIBC, IRON, RETICCTPCT in the last 72 hours. Sepsis Labs: No results for input(s): PROCALCITON, LATICACIDVEN in the last 168 hours.  Recent Results (from the past 240 hours)  MRSA Next Gen by PCR, Nasal     Status: None   Collection Time: 08/19/24  5:00 PM   Specimen: Nasal  Mucosa; Nasal Swab  Result Value Ref Range Status   MRSA by PCR Next Gen NOT DETECTED NOT DETECTED Final    Comment: (NOTE) The GeneXpert MRSA Assay (FDA approved for NASAL specimens only), is one component of a comprehensive MRSA colonization surveillance program. It is not intended to diagnose MRSA infection nor to guide or monitor treatment for MRSA infections. Test performance is not FDA approved in patients less than 84 years old. Performed at [facility], [address]  [place], [place] [address]          Radiology Studies: No results found.       Scheduled Meds:  Chlorhexidine Gluconate Cloth  6 each Topical QHS   heparin   5,000 Units Subcutaneous Q8H   levothyroxine   75 mcg Oral Q0600   lidocaine   1 patch Transdermal Q24H   sodium chloride   1 g Oral BID WC   Continuous Infusions:   LOS: 3 days      Anthony CHRISTELLA Pouch, MD [facility] Pager [phone]  If 7PM-7AM, please contact night-coverage [email or web address] 08/22/2024, 8:45 AM

## 2024-08-22 NOTE — Evaluation (Addendum)
 Physical Therapy Evaluation & Discharge Patient Details Name: Meagan Cain MRN: [id number] DOB: 05-27-1946 Today's Date: 08/22/2024  History of Present Illness  Pt is a 78 y/o F admitted on 08/19/24 after presenting with c/o nausea & dizziness, referred to hospital from outpatient MD 2/2 hyponatremia & hypokalemia. Pt with fall prior to admission. Imaging showed Possible acute right fourth lateral rib fracture. PMH: arthritis, skin CA, cystocele, depression, shingles, HTN, hypothyroidism, osteoporosis, rectocele, thoracic compression fx, urinary incontinence, vaginal atrophy, vit D deficiency  Clinical Impression  Pt seen for PT evaluation with pt agreeable, son present in room. Pt reports prior to admission she was ambulatory with rollator in the home, [facility] outside of the home, still driving, notes 1 fall prior to admission 2/2 not feeling well. On this date, pt ambulates with RW & supervision fade to mod I, negotiates stairs with R rail & CGA<>supervision. Pt appears to be at or near baseline level of mobility & does not require acute PT services. PT to complete current orders at this time. Please re-consult if new needs arise.  HR 90 bpm with mobility.      If plan is discharge home, recommend the following: Help with stairs or ramp for entrance   Can travel by private vehicle        Equipment Recommendations None recommended by PT  Recommendations for Other Services       Functional Status Assessment Patient has not had a recent decline in their functional status     Precautions / Restrictions Precautions Precautions: Fall Restrictions Weight Bearing Restrictions Per Provider Order: No      Mobility  Bed Mobility Overal bed mobility: Modified Independent Bed Mobility: Supine to Sit     Supine to sit: Modified independent (Device/Increase time), HOB elevated, Used rails (exit L side of bed, HOB significantly elevated)          Transfers Overall transfer level: Needs  assistance Equipment used: Rolling walker (2 wheels) Transfers: Sit to/from Stand Sit to Stand: Modified independent (Device/Increase time)           General transfer comment: from EOB    Ambulation/Gait Ambulation/Gait assistance: Supervision, Modified independent (Device/Increase time) Gait Distance (Feet): 200 Feet Assistive device: Rolling walker (2 wheels) Gait Pattern/deviations: Decreased step length - left, Decreased step length - right, Decreased stride length Gait velocity: slightly decreased        Stairs Stairs: Yes Stairs assistance: Supervision, Contact guard assist Stair Management: One rail Right, Step to pattern Number of Stairs: 3 (6) General stair comments: Educated pt on ability to hold to rail with BUE vs 1UE for increased support.  Wheelchair Mobility     Tilt Bed    Modified Rankin (Stroke Patients Only)       Balance Overall balance assessment: Needs assistance Sitting-balance support: Feet supported Sitting balance-Leahy Scale: Good     Standing balance support: Bilateral upper extremity supported, Reliant on assistive device for balance Standing balance-Leahy Scale: Good                               Pertinent Vitals/Pain Pain Assessment Pain Assessment: Faces Faces Pain Scale: Hurts little more Pain Location: R sided rib pain with deep inhalation Pain Descriptors / Indicators: Discomfort Pain Intervention(s): Monitored during session    Home Living Family/patient expects to be discharged to:: Private residence Living Arrangements: Spouse/significant other Available Help at Discharge: Family Type of Home: House Home Access: Stairs  to enter Entrance Stairs-Rails: Right Entrance Stairs-Number of Steps: 3   Home Layout: Laundry or work area in basement;Able to live on main level with bedroom/bathroom Home Equipment: Rollator (4 wheels);Cane - single point      Prior Function Prior Level of Function : Driving              Mobility Comments: ambulatory with rollator in the home, [facility] outside of the home, reports 1 fall prior to admission ADLs Comments: independent, reports she manages her own meds, cooks, cleans     Extremity/Trunk Assessment   Upper Extremity Assessment Upper Extremity Assessment: Overall WFL for tasks assessed    Lower Extremity Assessment Lower Extremity Assessment: Overall WFL for tasks assessed       Communication   Communication Communication: No apparent difficulties    Cognition Arousal: Alert Behavior During Therapy: WFL for tasks assessed/performed   PT - Cognitive impairments: No apparent impairments                         Following commands: Intact       Cueing Cueing Techniques: Verbal cues     General Comments General comments (skin integrity, edema, etc.): Educated pt on use of incentive spirometer with pt return demonstrating.    Exercises     Assessment/Plan    PT Assessment Patient does not need any further PT services  PT Problem List         PT Treatment Interventions      PT Goals (Current goals can be found in the Care Plan section)  Acute Rehab PT Goals Patient Stated Goal: get better PT Goal Formulation: All assessment and education complete, DC therapy Time For Goal Achievement: 09/05/24 Potential to Achieve Goals: Good    Frequency       Co-evaluation               AM-PAC PT 6 Clicks Mobility  Outcome Measure Help needed turning from your back to your side while in a flat bed without using bedrails?: None Help needed moving from lying on your back to sitting on the side of a flat bed without using bedrails?: None Help needed moving to and from a bed to a chair (including a wheelchair)?: None Help needed standing up from a chair using your arms (e.g., wheelchair or bedside chair)?: None Help needed to walk in hospital room?: None Help needed climbing 3-5 steps with a railing? : A Little 6  Click Score: 23    End of Session   Activity Tolerance: Patient tolerated treatment well Patient left: in chair;with chair alarm set;with call bell/phone within reach;with family/visitor present        Time: 1011-1037 PT Time Calculation (min) (ACUTE ONLY): 26 min   Charges:   PT Evaluation $PT Eval Low Complexity: 1 Low   PT General Charges $$ ACUTE PT VISIT: 1 Visit         Richerd Pinal, PT, DPT 08/22/24, 10:46 AM   Richerd CHRISTELLA Pinal 08/22/2024, 10:45 AM

## 2024-08-23 DIAGNOSIS — E871 Hypo-osmolality and hyponatremia: Secondary | ICD-10-CM | POA: Diagnosis not present

## 2024-08-23 LAB — BASIC METABOLIC PANEL WITH GFR
Anion gap: 8 (ref 5–15)
Anion gap: 10 (ref 5–15)
BUN: 10 mg/dL (ref 8–23)
BUN: 8 mg/dL (ref 8–23)
CO2: 26 mmol/L (ref 22–32)
CO2: 26 mmol/L (ref 22–32)
Calcium: 8.9 mg/dL (ref 8.9–10.3)
Calcium: 8.9 mg/dL (ref 8.9–10.3)
Chloride: 88 mmol/L — ABNORMAL LOW (ref 98–111)
Chloride: 91 mmol/L — ABNORMAL LOW (ref 98–111)
Creatinine, Ser: 0.5 mg/dL (ref 0.44–1.00)
Creatinine, Ser: 0.53 mg/dL (ref 0.44–1.00)
GFR, Estimated: >60 mL/min (ref >60)
GFR, Estimated: >60 mL/min (ref >60)
Glucose, Bld: 121 mg/dL — ABNORMAL HIGH (ref 70–99)
Glucose, Bld: 141 mg/dL — ABNORMAL HIGH (ref 70–99)
Potassium: 3.6 mmol/L (ref 3.5–5.1)
Potassium: 3.7 mmol/L (ref 3.5–5.1)
Sodium: 122 mmol/L — ABNORMAL LOW (ref 135–145)
Sodium: 126 mmol/L — ABNORMAL LOW (ref 135–145)

## 2024-08-23 LAB — HEPATIC FUNCTION PANEL
ALT: 10 U/L (ref 0–44)
AST: 17 U/L (ref 15–41)
Albumin: 3.5 g/dL (ref 3.5–5.0)
Alkaline Phosphatase: 81 U/L (ref 38–126)
Bilirubin, Direct: 0.1 mg/dL (ref 0.0–0.2)
Indirect Bilirubin: 0.1 mg/dL — ABNORMAL LOW (ref 0.3–0.9)
Total Bilirubin: 0.2 mg/dL (ref 0.0–1.2)
Total Protein: 6.1 g/dL — ABNORMAL LOW (ref 6.5–8.1)

## 2024-08-23 LAB — TSH: TSH: 5.1 u[IU]/mL — ABNORMAL HIGH (ref 0.350–4.500)

## 2024-08-23 LAB — CBC
HCT: 31.9 % — ABNORMAL LOW (ref 36.0–46.0)
Hemoglobin: 11 g/dL — ABNORMAL LOW (ref 12.0–15.0)
MCH: 28.3 pg (ref 26.0–34.0)
MCHC: 34.5 g/dL (ref 30.0–36.0)
MCV: 82 fL (ref 80.0–100.0)
Platelets: 271 K/uL (ref 150–400)
RBC: 3.89 MIL/uL (ref 3.87–5.11)
RDW: 14.6 % (ref 11.5–15.5)
WBC: 7.8 K/uL (ref 4.0–10.5)
nRBC: 0 % (ref 0.0–0.2)

## 2024-08-23 LAB — SODIUM
Sodium: 124 mmol/L — ABNORMAL LOW (ref 135–145)
Sodium: 125 mmol/L — ABNORMAL LOW (ref 135–145)
Sodium: 126 mmol/L — ABNORMAL LOW (ref 135–145)

## 2024-08-23 LAB — MAGNESIUM: Magnesium: 1.8 mg/dL (ref 1.7–2.4)

## 2024-08-23 MED ORDER — TOLVAPTAN 15 MG PO TABS
15.0000 mg | ORAL_TABLET | Freq: Once | ORAL | Status: AC
  Administered 2024-08-23: 15 mg via ORAL
  Filled 2024-08-23: qty 1

## 2024-08-23 NOTE — Plan of Care (Signed)
  Problem: Safety: Goal: Ability to remain free from injury will improve Outcome: Progressing   Problem: Pain Managment: Goal: General experience of comfort will improve and/or be controlled Outcome: Progressing   Problem: Elimination: Goal: Will not experience complications related to bowel motility Outcome: Progressing   Problem: Nutrition: Goal: Adequate nutrition will be maintained Outcome: Progressing

## 2024-08-23 NOTE — Progress Notes (Signed)
 PHARMACY CONSULT NOTE - Tolvaptan  Pharmacy Consult for Tolvaptan Monitoring  Recent Labs: Potassium (mmol/L)  Date Value  08/23/2024 3.6   Magnesium  (mg/dL)  Date Value  88/81/7974 1.8   Calcium  (mg/dL)  Date Value  88/81/7974 8.9   Albumin (g/dL)  Date Value  88/82/7974 3.4 (L)   Phosphorus (mg/dL)  Date Value  88/82/7974 3.0   Sodium (mmol/L)  Date Value  08/23/2024 125 (L)    Assessment  Meagan Cain is a 78 y.o. female presenting from PCP with abnormal labs. PMH significant for hypertension, hyperlipidemia, pericardial effusion, and hypothyroidism.  Patient found to have serum Na of 115 on admission. Pharmacy has been consulted to monitor tolvaptan.  Pertinent medications: NaCl 1 g tab twice daily Drug interactions: N/A  Goal of Therapy:  Na >130 Do not exceed increase in Na by 8 mEq/L per 8 hours or 12 mEq/L in 24 hours  Plan:  Give tolvaptan 15 mg x1 Check Na Q8H x2 then daily thereafter Continue to monitor for signs of clinical improvement and recommendations per nephrology  Damien Napoleon, PharmD Clinical Pharmacist 08/23/2024 2:22 PM

## 2024-08-23 NOTE — Progress Notes (Addendum)
 [facility]  PROGRESS NOTE   Subjective:   Patient seen sitting up in chair Ate small amount of breakfast Was able to tolerate lunch and dinner yesterday Reports very little fluid intake  Sodium 124  Objective:  Vital signs: Blood pressure (!) 140/69, pulse (!) 58, temperature 98.3 F (36.8 C), resp. rate 17, height 5' 1 (1.549 m), weight 58.8 kg, SpO2 98%.  Intake/Output Summary (Last 24 hours) at 08/23/2024 1108 Last data filed at 08/23/2024 0900 Gross per 24 hour  Intake 820 ml  Output --  Net 820 ml   Filed Weights   08/21/24 0407 08/22/24 0500 08/23/24 0500  Weight: 63.6 kg 57.9 kg 58.8 kg     Physical Exam: General:  No acute distress  Head:  Normocephalic, atraumatic. Moist oral mucosal membranes  Eyes:  Anicteric  Lungs:   Clear to auscultation, normal effort  Heart:  S1S2 no rubs  Abdomen:   Soft, nontender, bowel sounds present  Extremities:  No peripheral edema.  Neurologic:  Awake, alert, following commands  Skin:  No lesions       Basic Metabolic Panel: Recent Labs  Lab 08/19/24 1350 08/19/24 1651 08/20/24 0551 08/20/24 1102 08/20/24 1424 08/20/24 1825 08/21/24 0225 08/21/24 0630 08/21/24 1028 08/22/24 0210 08/22/24 0212 08/22/24 1842 08/22/24 2205 08/23/24 0207 08/23/24 0553 08/23/24 0958  NA 115*   < > 122*   < > 120*   < > 123* 123*   < > 123*   < > 123* 125* 122* 124* 125*  K 2.5*   < > 3.0*  --  4.2  --  3.9  --   --  3.6  --   --   --  3.6  --   --   CL 69*   < > 85*  --  87*  --  87*  --   --  88*  --   --   --  88*  --   --   CO2 33*   < > 30  --  25  --  26  --   --  27  --   --   --  26  --   --   GLUCOSE 114*   < > 101*  --  183*  --  95  --   --  98  --   --   --  121*  --   --   BUN 9   < > 5*  --  <5*  --  5*  --   --  <5*  --   --   --  10  --   --   CREATININE 0.56   < > 0.44  --  0.50  --  0.49  --   --  0.49  --   --   --  0.50  --   --   CALCIUM  9.4   < > 8.2*  --  8.1*  --  8.2*  --   --  8.4*  --    --   --  8.9  --   --   MG 2.1  --  2.5*  --   --   --   --  2.0  --  1.9  --   --   --  1.8  --   --   PHOS  --   --  2.0*  --  2.0*  --  2.5  --   --  3.0  --   --   --   --   --   --    < > =  values in this interval not displayed.   GFR: Estimated Creatinine Clearance: 47 mL/min (by C-G formula based on SCr of 0.5 mg/dL).  Liver Function Tests: Recent Labs  Lab 08/19/24 1350 08/20/24 0551 08/20/24 1424 08/21/24 0225 08/22/24 0210  AST 23  --   --   --   --   ALT 18  --   --   --   --   ALKPHOS 94  --   --   --   --   BILITOT 0.7  --   --   --   --   PROT 7.3  --   --   --   --   ALBUMIN 4.3 3.5 3.6 3.3* 3.4*   No results for input(s): LIPASE, AMYLASE in the last 168 hours. No results for input(s): AMMONIA in the last 168 hours.  CBC: Recent Labs  Lab 08/19/24 1350 08/20/24 0551 08/21/24 0630 08/22/24 0210 08/23/24 0207  WBC 11.6* 7.0 7.7 8.1 7.8  HGB 13.4 11.0* 11.0* 10.9* 11.0*  HCT 36.1 30.1* 32.4* 31.6* 31.9*  MCV 77.0* 77.8* 82.9 83.2 82.0  PLT 328 288 295 267 271     HbA1C: No results found for: HGBA1C  Urinalysis: No results for input(s): COLORURINE, LABSPEC, PHURINE, GLUCOSEU, HGBUR, BILIRUBINUR, KETONESUR, PROTEINUR, UROBILINOGEN, NITRITE, LEUKOCYTESUR in the last 72 hours.  Invalid input(s): APPERANCEUR     Imaging: No results found.    Medications:     amLODipine  10 mg Oral Daily   Chlorhexidine Gluconate Cloth  6 each Topical QHS   feeding supplement  237 mL Oral BID BM   heparin   5,000 Units Subcutaneous Q8H   levothyroxine   75 mcg Oral Q0600   lidocaine   1 patch Transdermal Q24H   sodium chloride   1 g Oral BID WC    Assessment/ Plan:     78 y.o. female with a PMHx of hypertension, hyperlipidemia, hypothyroidism now admitted with 2-week history of feeling dizzy, nausea and poor p.o. intake.  She was recently started on chlorthalidone.  She went to her primary medical doctor who did blood work  showing a sodium of 115 and a potassium of 2.1.  She denied any chest pain, shortness of breath or mental status changes.   Principal Problem:   Hyponatremia   #1: Hyponatremia: Hyponatremia most likely secondary to thiazide diuretic and decreased solute intake.  Patient received normal saline and "Ringer's"  lactate on admission.    Sodium 124. Continue salt tabs. Will order Tolvaptan 15mg  once. May consider increasing salt tabs but hesitant due to side effects in this population. TSH 5.1, SPEP/UPEP pending   #2: Hypokalemia: Corrected   #3: Hypophosphatemia: Corrected   LOS: 4 [facility] 11/18/202511:08 AM

## 2024-08-23 NOTE — Plan of Care (Signed)

## 2024-08-23 NOTE — Progress Notes (Signed)
 PROGRESS NOTE   HPI was taken from NP Shellia: Meagan Cain is a 78 y.o female with a past medical history of hypertension, hyperlipidemia, pericardial effusion, and hypothyroidism who presents to [facility] ED on 08/19/24 from her PCP office due to abnormal labs.   She reports that for the past 2 weeks she has been nauseous with poor PO intake, along with fatigue, and dizziness/lightheadedness.  She was recently switched to Chlorthalidone from Diltiazem by her Cardiologist on 07/11/2024, and reports she hasn't felt well since starting it.  She actually stopped taking the Chlorthalidone and switched back to Diltiazem earlier this week on Monday 08/15/24.  She denies chest pain, shortness of breath, abdominal pain, diarrhea, vomiting, fever, dysuria.   Given her persistent symptoms she was seen at her PCP today, and was referred directly to the ED as labs showed a sodium of 115 and potassium of 2.1.  She also endorsees a fall where she landed on her right chest, but denies hitting her head or neck, loss of consciousness, or any other injuries.     ED Course: Initial Vital Signs: Temperature 98.3 F, pulse 73, RR 18, BP 128/75, SpO2 99% on room air Significant Labs: Sodium 115, potassium 2.5, chloride 69, bicarb 33, glucose 114, WBC 11.6 UA negative for UTI Imaging Chest X-ray>>IMPRESSION: Possible acute right fourth lateral rib fracture. No pneumothorax. Medications Administered: Potassium repletion ordered    INARA DIKE  FMW:[id number] DOB: December 17, 1945 DOA: 08/19/2024 PCP: Auston Reyes BIRCH, MD    Assessment & Plan:   Principal Problem:   Hyponatremia  Assessment and Plan: Severe hyponatremia: likely hypotonic hypovolemic in setting of poor po intake and chlorthalidone use. S/p D5W for rapid overcorrection of sodium. Continue on NaCl tabs and may increase as per nephro. Na level is labile. Nephro following and recs apprec   HTN: home dose of chlorthalidone was d/c secondary to severe  hyponatremia. Home dose of diltiazem was d/c by PCP secondary to dry mouth. Started on amlodipine  Hypochloremia: slowly trending up Will continue to monitor   Hypophosphatemia: WNL today   Hypokalemia: WNL today   Nausea: likely secondary to hyponatremia. Zofran  prn    Leukocytosis: resolved   Right 4th rib fracture: continue w/ supportive care & continue w/ lidocaine  patch. Encourage incentive spirometry         DVT prophylaxis: lovenox  Code Status: full  Family Communication: discussed pt's care w/ pt's son at bedside and answered his questions  Disposition Plan: likely d/c back home  Level of care: Telemetry  Status is: Inpatient Remains inpatient appropriate because: severity of illness, Na 124    Consultants:  Nephro   Procedures:   Antimicrobials:    Subjective: Pt c/o fatigue   Objective: Vitals:   08/22/24 1929 08/23/24 0437 08/23/24 0500 08/23/24 0725  BP: 119/66 121/68  (!) 140/69  Pulse: 74 69  (!) 58  Resp: 17 17  17   Temp: 98.4 F (36.9 C) 98.4 F (36.9 C)  98.3 F (36.8 C)  TempSrc:      SpO2: 98% 98%  98%  Weight:   58.8 kg   Height:        Intake/Output Summary (Last 24 hours) at 08/23/2024 0832 Last data filed at 08/22/2024 1700 Gross per 24 hour  Intake 840 ml  Output --  Net 840 ml   Filed Weights   08/21/24 0407 08/22/24 0500 08/23/24 0500  Weight: 63.6 kg 57.9 kg 58.8 kg    Examination:  General exam: appears  calm & comfortable  Respiratory system: clear breath sounds b/l  Cardiovascular system: S1 &S2+. No rubs or clicks Gastrointestinal system: abd is soft, NT, ND & hypoactive bowel sounds  Central nervous system: alert & oriented. Moves all extremities  Psychiatry:judgement and insight appears at baseline. Flat mood and affect     Data Reviewed: I have personally reviewed following labs and imaging studies  CBC: Recent Labs  Lab 08/19/24 1350 08/20/24 0551 08/21/24 0630 08/22/24 0210 08/23/24 0207   WBC 11.6* 7.0 7.7 8.1 7.8  HGB 13.4 11.0* 11.0* 10.9* 11.0*  HCT 36.1 30.1* 32.4* 31.6* 31.9*  MCV 77.0* 77.8* 82.9 83.2 82.0  PLT 328 288 295 267 271   Basic Metabolic Panel: Recent Labs  Lab 08/19/24 1350 08/19/24 1651 08/20/24 0551 08/20/24 1102 08/20/24 1424 08/20/24 1825 08/21/24 0225 08/21/24 0630 08/21/24 1028 08/22/24 0210 08/22/24 0212 08/22/24 1349 08/22/24 1842 08/22/24 2205 08/23/24 0207 08/23/24 0553  NA 115*   < > 122*   < > 120*   < > 123* 123*   < > 123*   < > 124* 123* 125* 122* 124*  K 2.5*   < > 3.0*  --  4.2  --  3.9  --   --  3.6  --   --   --   --  3.6  --   CL 69*   < > 85*  --  87*  --  87*  --   --  88*  --   --   --   --  88*  --   CO2 33*   < > 30  --  25  --  26  --   --  27  --   --   --   --  26  --   GLUCOSE 114*   < > 101*  --  183*  --  95  --   --  98  --   --   --   --  121*  --   BUN 9   < > 5*  --  <5*  --  5*  --   --  <5*  --   --   --   --  10  --   CREATININE 0.56   < > 0.44  --  0.50  --  0.49  --   --  0.49  --   --   --   --  0.50  --   CALCIUM  9.4   < > 8.2*  --  8.1*  --  8.2*  --   --  8.4*  --   --   --   --  8.9  --   MG 2.1  --  2.5*  --   --   --   --  2.0  --  1.9  --   --   --   --  1.8  --   PHOS  --   --  2.0*  --  2.0*  --  2.5  --   --  3.0  --   --   --   --   --   --    < > = values in this interval not displayed.   GFR: Estimated Creatinine Clearance: 47 mL/min (by C-G formula based on SCr of 0.5 mg/dL). Liver Function Tests: Recent Labs  Lab 08/19/24 1350 08/20/24 0551 08/20/24 1424 08/21/24 0225 08/22/24 0210  AST 23  --   --   --   --  ALT 18  --   --   --   --   ALKPHOS 94  --   --   --   --   BILITOT 0.7  --   --   --   --   PROT 7.3  --   --   --   --   ALBUMIN 4.3 3.5 3.6 3.3* 3.4*   No results for input(s): LIPASE, AMYLASE in the last 168 hours. No results for input(s): AMMONIA in the last 168 hours. Coagulation Profile: No results for input(s): INR, PROTIME in the last 168  hours. Cardiac Enzymes: No results for input(s): CKTOTAL, CKMB, CKMBINDEX, TROPONINI in the last 168 hours. BNP (last 3 results) No results for input(s): PROBNP in the last 8760 hours. HbA1C: No results for input(s): HGBA1C in the last 72 hours. CBG: Recent Labs  Lab 08/20/24 1217 08/20/24 1248 08/20/24 1537  GLUCAP 66* 64* 98   Lipid Profile: No results for input(s): CHOL, HDL, LDLCALC, TRIG, CHOLHDL, LDLDIRECT in the last 72 hours. Thyroid  Function Tests: Recent Labs    08/23/24 0207  TSH 5.100*   Anemia Panel: No results for input(s): VITAMINB12, FOLATE, FERRITIN, TIBC, IRON, RETICCTPCT in the last 72 hours. Sepsis Labs: No results for input(s): PROCALCITON, LATICACIDVEN in the last 168 hours.  Recent Results (from the past 240 hours)  MRSA Next Gen by PCR, Nasal     Status: None   Collection Time: 08/19/24  5:00 PM   Specimen: Nasal Mucosa; Nasal Swab  Result Value Ref Range Status   MRSA by PCR Next Gen NOT DETECTED NOT DETECTED Final    Comment: (NOTE) The GeneXpert MRSA Assay (FDA approved for NASAL specimens only), is one component of a comprehensive MRSA colonization surveillance program. It is not intended to diagnose MRSA infection nor to guide or monitor treatment for MRSA infections. Test performance is not FDA approved in patients less than 31 years old. Performed at [facility], [address] [place], [place] [address]          Radiology Studies: No results found.       Scheduled Meds:  amLODipine  10 mg Oral Daily   Chlorhexidine Gluconate Cloth  6 each Topical QHS   feeding supplement  237 mL Oral BID BM   heparin   5,000 Units Subcutaneous Q8H   levothyroxine   75 mcg Oral Q0600   lidocaine   1 patch Transdermal Q24H   sodium chloride   1 g Oral BID WC   Continuous Infusions:   LOS: 4 days      Anthony CHRISTELLA Pouch, MD [facility] Pager [phone]  If 7PM-7AM, please  contact night-coverage [email or web address] 08/23/2024, 8:32 AM

## 2024-08-23 NOTE — Progress Notes (Signed)
 Mobility Specialist Progress Note:    08/23/24 1018  Mobility  Activity Ambulated with assistance;Pivoted/transferred from chair to bed  Level of Assistance Standby assist, set-up cues, supervision of patient - no hands on  Assistive Device Front wheel walker  Distance Ambulated (ft) 250 ft  Range of Motion/Exercises Active;All extremities  Activity Response Tolerated well  Mobility visit 1 Mobility  Mobility Specialist Start Time (ACUTE ONLY) 1000  Mobility Specialist Stop Time (ACUTE ONLY) 1014  Mobility Specialist Time Calculation (min) (ACUTE ONLY) 14 min   Pt received in chair, son in room. Agreeable to mobility, required supervision to stand and ambulate with RW. Tolerated well, asx throughout. Returned to room, left pt semi fowlers. Alarm on and belongings in reach, all needs met.  Sherrilee Ditty Mobility Specialist Please contact via Special Educational Needs Teacher or  Rehab office at [phone]

## 2024-08-24 DIAGNOSIS — I10 Essential (primary) hypertension: Secondary | ICD-10-CM

## 2024-08-24 LAB — SODIUM
Sodium: 129 mmol/L — ABNORMAL LOW (ref 135–145)
Sodium: 130 mmol/L — ABNORMAL LOW (ref 135–145)

## 2024-08-24 MED ORDER — ENSURE PLUS HIGH PROTEIN PO LIQD: 237.0000 mL | Freq: Two times a day (BID) | ORAL | 0 refills | Status: AC

## 2024-08-24 NOTE — Progress Notes (Signed)
 [facility]  PROGRESS NOTE   Subjective:   Patient seen sitting up in bed Family at bedside Denies pain Denies shortness of breath Tolerating small meals.   Sodium 130  Objective:  Vital signs: Blood pressure (!) 106/59, pulse (!) 59, temperature 97.9 F (36.6 C), resp. rate 18, height 5' 1 (1.549 m), weight 58.1 kg, SpO2 99%.  Intake/Output Summary (Last 24 hours) at 08/24/2024 1447 Last data filed at 08/24/2024 1017 Gross per 24 hour  Intake 300 ml  Output --  Net 300 ml   Filed Weights   08/23/24 0500 08/24/24 0500 08/24/24 0623  Weight: 58.8 kg 58.1 kg 58.1 kg     Physical Exam: General:  No acute distress  Head:  Normocephalic, atraumatic. Moist oral mucosal membranes  Eyes:  Anicteric  Lungs:   Clear to auscultation, normal effort  Heart:  S1S2 no rubs  Abdomen:   Soft, nontender, bowel sounds present  Extremities:  No peripheral edema.  Neurologic:  Awake, alert, following commands  Skin:  No lesions       Basic Metabolic Panel: Recent Labs  Lab 08/19/24 1350 08/19/24 1651 08/20/24 0551 08/20/24 1102 08/20/24 1424 08/20/24 1825 08/21/24 0225 08/21/24 0630 08/21/24 1028 08/22/24 0210 08/22/24 0212 08/23/24 0207 08/23/24 0553 08/23/24 0958 08/23/24 1359 08/23/24 1858 08/24/24 0224 08/24/24 0613  NA 115*   < > 122*   < > 120*   < > 123* 123*   < > 123*   < > 122*   < > 125* 126* 126* 129* 130*  K 2.5*   < > 3.0*  --  4.2  --  3.9  --   --  3.6  --  3.6  --   --  3.7  --   --   --   CL 69*   < > 85*  --  87*  --  87*  --   --  88*  --  88*  --   --  91*  --   --   --   CO2 33*   < > 30  --  25  --  26  --   --  27  --  26  --   --  26  --   --   --   GLUCOSE 114*   < > 101*  --  183*  --  95  --   --  98  --  121*  --   --  141*  --   --   --   BUN 9   < > 5*  --  <5*  --  5*  --   --  <5*  --  10  --   --  8  --   --   --   CREATININE 0.56   < > 0.44  --  0.50  --  0.49  --   --  0.49  --  0.50  --   --  0.53  --   --   --    CALCIUM  9.4   < > 8.2*  --  8.1*  --  8.2*  --   --  8.4*  --  8.9  --   --  8.9  --   --   --   MG 2.1  --  2.5*  --   --   --   --  2.0  --  1.9  --  1.8  --   --   --   --   --   --  PHOS  --   --  2.0*  --  2.0*  --  2.5  --   --  3.0  --   --   --   --   --   --   --   --    < > = values in this interval not displayed.   GFR: Estimated Creatinine Clearance: 46.7 mL/min (by C-G formula based on SCr of 0.53 mg/dL).  Liver Function Tests: Recent Labs  Lab 08/19/24 1350 08/20/24 0551 08/20/24 1424 08/21/24 0225 08/22/24 0210 08/23/24 1359  AST 23  --   --   --   --  17  ALT 18  --   --   --   --  10  ALKPHOS 94  --   --   --   --  81  BILITOT 0.7  --   --   --   --  0.2  PROT 7.3  --   --   --   --  6.1*  ALBUMIN 4.3 3.5 3.6 3.3* 3.4* 3.5   No results for input(s): LIPASE, AMYLASE in the last 168 hours. No results for input(s): AMMONIA in the last 168 hours.  CBC: Recent Labs  Lab 08/19/24 1350 08/20/24 0551 08/21/24 0630 08/22/24 0210 08/23/24 0207  WBC 11.6* 7.0 7.7 8.1 7.8  HGB 13.4 11.0* 11.0* 10.9* 11.0*  HCT 36.1 30.1* 32.4* 31.6* 31.9*  MCV 77.0* 77.8* 82.9 83.2 82.0  PLT 328 288 295 267 271     HbA1C: No results found for: HGBA1C  Urinalysis: No results for input(s): COLORURINE, LABSPEC, PHURINE, GLUCOSEU, HGBUR, BILIRUBINUR, KETONESUR, PROTEINUR, UROBILINOGEN, NITRITE, LEUKOCYTESUR in the last 72 hours.  Invalid input(s): APPERANCEUR     Imaging: No results found.    Medications:     amLODipine  10 mg Oral Daily   Chlorhexidine Gluconate Cloth  6 each Topical QHS   feeding supplement  237 mL Oral BID BM   heparin   5,000 Units Subcutaneous Q8H   levothyroxine   75 mcg Oral Q0600   lidocaine   1 patch Transdermal Q24H   sodium chloride   1 g Oral BID WC    Assessment/ Plan:     78 y.o. female with a PMHx of hypertension, hyperlipidemia, hypothyroidism now admitted with 2-week history of feeling dizzy,  nausea and poor p.o. intake.  She was recently started on chlorthalidone.  She went to her primary medical doctor who did blood work showing a sodium of 115 and a potassium of 2.1.  She denied any chest pain, shortness of breath or mental status changes.   Principal Problem:   Hyponatremia   #1: Hyponatremia: Hyponatremia most likely secondary to thiazide diuretic and decreased solute intake.  Patient received normal saline and "Ringer's"  lactate on admission.    Sodium responded appropriately to Tolvaptan, 130 today.  Patient encouraged to maintain a healthy diet with protein and limit fluid intake to 40oz per day. Would encourage continued protein supplementation. TSH 5.1, SPEP/UPEP pending   #2: Hypokalemia: Corrected   #3: Hypophosphatemia: Corrected   LOS: 5 [facility] 11/19/20252:47 PM

## 2024-08-24 NOTE — Progress Notes (Signed)
 PHARMACY CONSULT NOTE - Tolvaptan  Pharmacy Consult for Tolvaptan Monitoring  Recent Labs: Potassium (mmol/L)  Date Value  08/23/2024 3.7   Magnesium  (mg/dL)  Date Value  88/81/7974 1.8   Calcium  (mg/dL)  Date Value  88/81/7974 8.9   Albumin (g/dL)  Date Value  88/81/7974 3.5   Phosphorus (mg/dL)  Date Value  88/82/7974 3.0   Sodium (mmol/L)  Date Value  08/24/2024 129 (L)    Assessment  Meagan Cain is a 78 y.o. female presenting from PCP with abnormal labs. PMH significant for hypertension, hyperlipidemia, pericardial effusion, and hypothyroidism.  Patient found to have serum Na of 115 on admission. Pharmacy has been consulted to monitor tolvaptan.  Pertinent medications: NaCl 1 g tab twice daily Drug interactions: N/A  Goal of Therapy:  Na >130 Do not exceed increase in Na by 8 mEq/L per 8 hours or 12 mEq/L in 24 hours  Plan:  11/18:   Tolvaptan 15 mg PO X 1 given @ 1619 11/18:   Na @ 1858 = 126 11/19:   Na @ 0224 = 129 - 3 mEq increase in ~ 7.5 hrs - recheck Na @ ~ 1000  Continue to monitor for signs of clinical improvement and recommendations per nephrology  Miyana Mordecai D, PharmD Clinical Pharmacist 08/24/2024 3:07 AM

## 2024-08-24 NOTE — Plan of Care (Signed)

## 2024-08-25 DIAGNOSIS — E876 Hypokalemia: Secondary | ICD-10-CM | POA: Diagnosis not present

## 2024-08-25 DIAGNOSIS — E871 Hypo-osmolality and hyponatremia: Secondary | ICD-10-CM | POA: Diagnosis not present

## 2024-08-25 DIAGNOSIS — Z79899 Other long term (current) drug therapy: Secondary | ICD-10-CM | POA: Diagnosis not present

## 2024-08-25 DIAGNOSIS — R112 Nausea with vomiting, unspecified: Secondary | ICD-10-CM | POA: Diagnosis not present

## 2024-08-25 DIAGNOSIS — E079 Disorder of thyroid, unspecified: Secondary | ICD-10-CM | POA: Diagnosis not present

## 2024-08-25 DIAGNOSIS — I10 Essential (primary) hypertension: Secondary | ICD-10-CM | POA: Diagnosis not present

## 2024-08-25 LAB — PROTEIN ELECTROPHORESIS, SERUM
A/G Ratio: 1.1 (ref 0.7–1.7)
Albumin ELP: 2.9 g/dL (ref 2.9–4.4)
Alpha-1-Globulin: 0.2 g/dL (ref 0.0–0.4)
Alpha-2-Globulin: 0.7 g/dL (ref 0.4–1.0)
Beta Globulin: 0.8 g/dL (ref 0.7–1.3)
Gamma Globulin: 0.8 g/dL (ref 0.4–1.8)
Globulin, Total: 2.6 g/dL (ref 2.2–3.9)
Total Protein ELP: 5.5 g/dL — ABNORMAL LOW (ref 6.0–8.5)

## 2024-08-26 NOTE — Discharge Summary (Signed)
 Physician Discharge Summary   Patient: Meagan Cain MRN: [id number] DOB: Oct 09, 1945  Admit date:     08/19/2024  Discharge date: 08/24/2024  Discharge Physician: Meagan Cain   PCP: Meagan Reyes BIRCH, MD   Recommendations at discharge:    F/up with outpt providers as requested  Discharge Diagnoses: Principal Problem:   Hyponatremia Active Problems:   Essential hypertension  Cain Course: Assessment and Plan:  78 y.o female with a past medical history of hypertension, hyperlipidemia, pericardial effusion, and hypothyroidism who presents to [facility] ED on 08/19/24 from her PCP office due to abnormal labs.   She reports that for the past 2 weeks she has been nauseous with poor PO intake, along with fatigue, and dizziness/lightheadedness.  She was recently switched to Meagan Cain from Meagan Cain by her Cardiologist on 07/11/2024, and reports she hasn't felt well since starting it.  She actually stopped taking the Meagan Cain and switched back to Meagan Cain earlier this week on Monday 08/15/24.  She denies chest pain, shortness of breath, abdominal pain, diarrhea, vomiting, fever, dysuria.   Given her persistent symptoms she was seen at her PCP today, and was referred directly to the ED as labs showed a sodium of 115 and potassium of 2.1.  She also endorsees a fall where she landed on her right chest, but denies hitting her head or neck, loss of consciousness, or any other injuries.   Severe hyponatremia: most likely secondary to thiazide diuretic and decreased solute intake.  Patient received normal saline and "Ringer's"  lactate on admission.   Sodium responded appropriately to Tolvaptan , 130 today.  Patient encouraged to maintain a healthy diet with protein and limit fluid intake to 40oz per day. Would encourage continued protein supplementation   HTN: home dose of Meagan Cain was d/c secondary to severe hyponatremia. Home dose of Meagan Cain was d/c by PCP secondary to dry mouth. Started  on amlodipine    Hypochloremia: improved    Hypophosphatemia: repleted   Hypokalemia: repleted   Nausea: likely secondary to hyponatremia. Resolved now   Leukocytosis: resolved   Right 4th rib fracture: continue w/ supportive care & continue w/ lidocaine  patch. Encourage incentive spirometry          Consultants: Nephro  Disposition: Home Diet recommendation:  Discharge Diet Orders (From admission, onward)     Start     Ordered   08/24/24 0000  Diet - low sodium heart healthy        08/24/24 0927           Renal diet DISCHARGE MEDICATION: Allergies as of 08/24/2024       Reactions   Furosemide Diarrhea   Ceftin [cefuroxime Axetil]    Cephalosporins Nausea And Vomiting   Ibandronate Nausea And Vomiting   Sulfasalazine Dermatitis   Sulfa Antibiotics Rash        Medication List     STOP taking these medications    conjugated estrogens  0.625 MG/GM vaginal cream Commonly known as: PREMARIN    HYDROcodone -acetaminophen  5-325 MG tablet Commonly known as: NORCO/VICODIN   losartan  100 MG tablet Commonly known as: COZAAR    methocarbamol  500 MG tablet Commonly known as: ROBAXIN        TAKE these medications    aspirin  EC 81 MG tablet Take 1 tablet (81 mg total) by mouth 2 (two) times daily.   Meagan Cain 180 MG 24 hr capsule Commonly known as: CARDIZEM CD Take 180 mg by mouth daily.   docusate sodium  100 MG capsule Commonly known as: COLACE Take 1 capsule (100 mg  total) by mouth 2 (two) times daily.   feeding supplement Liqd Take 237 mLs by mouth 2 (two) times daily between meals.   levothyroxine  75 MCG tablet Commonly known as: SYNTHROID  Take 75 mcg by mouth daily before breakfast.   omeprazole 40 MG capsule Commonly known as: PRILOSEC Take 40 mg by mouth daily.   potassium chloride  10 MEQ tablet Commonly known as: KLOR-CON  Take 10 mEq by mouth daily.   RA Vitamin B-12 TR 1000 MCG Tbcr Generic drug: Cyanocobalamin  Take 1,000 mcg by  mouth daily.   rosuvastatin  5 MG tablet Commonly known as: CRESTOR  Take 5 mg by mouth daily.   Vitamin D3 50 MCG (2000 UT) capsule Take 1,000 Units by mouth daily.        Follow-up Information     Meagan Reyes BIRCH, MD. Schedule an appointment as soon as possible for a visit in 3 day(s).   Specialty: Internal Medicine Why: Meagan Cain Discharge F/UP Contact information: [address] [place] [address] [phone]         Meagan Capri, MD. Schedule an appointment as soon as possible for a visit in 1 week(s).   Specialty: Nephrology Why: Meagan Cain Discharge F/UP Contact information: [phone] [place] [address] [phone]                Discharge Exam: Meagan Cain   08/23/24 0500 08/24/24 0500 08/24/24 0623  Weight: 58.8 kg 58.1 kg 58.1 kg   General:  No acute distress  Head:  Normocephalic, atraumatic. Moist oral mucosal membranes  Eyes:  Anicteric  Lungs:   Clear to auscultation, normal effort  Heart:  S1S2 no rubs  Abdomen:   Soft, nontender, bowel sounds present  Extremities:  No peripheral edema.  Neurologic:  Awake, alert, following commands  Skin:  No lesions    Condition at discharge: good  The results of significant diagnostics from this hospitalization (including imaging, microbiology, ancillary and laboratory) are listed below for reference.   Imaging Studies: DG Abd 1 View Result Date: 08/19/2024 CLINICAL DATA:  Dizziness with nausea. EXAM: ABDOMEN - 1 VIEW COMPARISON:  None Available. FINDINGS: The bowel gas pattern is normal. Radiopaque surgical clips are present within the right upper quadrant. No radio-opaque calculi or other significant radiographic abnormality are seen. A radiopaque intramedullary rod and compression screw device are seen within the proximal right femur. IMPRESSION: Nonobstructive bowel gas pattern. Electronically Signed   By: Suzen Dials M.D.   On: 08/19/2024 17:55    DG Ribs Unilateral W/Chest Right Result Date: 08/19/2024 CLINICAL DATA:  Fall with right lower lateral rib pain EXAM: RIGHT RIBS AND CHEST - 3+ VIEW COMPARISON:  Chest x-ray 11/04/2022, chest CT 05/18/2024 FINDINGS: Single view chest demonstrates normal cardiac size. No acute airspace disease, pleural effusion, or pneumothorax. Right rib series demonstrates possible acute right fourth lateral rib fracture. IMPRESSION: Possible acute right fourth lateral rib fracture. No pneumothorax. Electronically Signed   By: Luke Bun M.D.   On: 08/19/2024 15:32    Microbiology: Results for orders placed or performed during the Cain encounter of 08/19/24  MRSA Next Gen by PCR, Nasal     Status: None   Collection Time: 08/19/24  5:00 PM   Specimen: Nasal Mucosa; Nasal Swab  Result Value Ref Range Status   MRSA by PCR Next Gen NOT DETECTED NOT DETECTED Final    Comment: (NOTE) The GeneXpert MRSA Assay (FDA approved for NASAL specimens only), is one component of a comprehensive  MRSA colonization surveillance program. It is not intended to diagnose MRSA infection nor to guide or monitor treatment for MRSA infections. Test performance is not FDA approved in patients less than 59 years old. Performed at [facility], [address] [place], [place] [address]     Labs: CBC: Recent Labs  Lab 08/20/24 0551 08/21/24 0630 08/22/24 0210 08/23/24 0207  WBC 7.0 7.7 8.1 7.8  HGB 11.0* 11.0* 10.9* 11.0*  HCT 30.1* 32.4* 31.6* 31.9*  MCV 77.8* 82.9 83.2 82.0  PLT 288 295 267 271   Basic Metabolic Panel: Recent Labs  Lab 08/20/24 0551 08/20/24 1102 08/20/24 1424 08/20/24 1825 08/21/24 0225 08/21/24 0630 08/21/24 1028 08/22/24 0210 08/22/24 0212 08/23/24 0207 08/23/24 0553 08/23/24 0958 08/23/24 1359 08/23/24 1858 08/24/24 0224 08/24/24 0613  NA 122*   < > 120*   < > 123* 123*   < > 123*   < > 122*   < > 125* 126* 126* 129* 130*  K 3.0*  --  4.2  --  3.9  --   --  3.6   --  3.6  --   --  3.7  --   --   --   CL 85*  --  87*  --  87*  --   --  88*  --  88*  --   --  91*  --   --   --   CO2 30  --  25  --  26  --   --  27  --  26  --   --  26  --   --   --   GLUCOSE 101*  --  183*  --  95  --   --  98  --  121*  --   --  141*  --   --   --   BUN 5*  --  <5*  --  5*  --   --  <5*  --  10  --   --  8  --   --   --   CREATININE 0.44  --  0.50  --  0.49  --   --  0.49  --  0.50  --   --  0.53  --   --   --   CALCIUM  8.2*  --  8.1*  --  8.2*  --   --  8.4*  --  8.9  --   --  8.9  --   --   --   MG 2.5*  --   --   --   --  2.0  --  1.9  --  1.8  --   --   --   --   --   --   PHOS 2.0*  --  2.0*  --  2.5  --   --  3.0  --   --   --   --   --   --   --   --    < > = values in this interval not displayed.   Liver Function Tests: Recent Labs  Lab 08/20/24 0551 08/20/24 1424 08/21/24 0225 08/22/24 0210 08/23/24 1359  AST  --   --   --   --  17  ALT  --   --   --   --  10  ALKPHOS  --   --   --   --  38  BILITOT  --   --   --   --  0.2  PROT  --   --   --   --  6.1*  ALBUMIN 3.5 3.6 3.3* 3.4* 3.5   CBG: Recent Labs  Lab 08/20/24 1217 08/20/24 1248 08/20/24 1537  GLUCAP 66* 64* 98    Discharge time spent: greater than 30 minutes.  Signed: Cresencio Fairly, MD [facility] 08/26/2024

## 2024-08-30 LAB — OSMOLALITY, URINE: Osmolality, Ur: 184 mosm/kg — ABNORMAL LOW (ref 300–900)

## 2024-08-31 DIAGNOSIS — Z79899 Other long term (current) drug therapy: Secondary | ICD-10-CM | POA: Diagnosis not present

## 2024-08-31 DIAGNOSIS — I10 Essential (primary) hypertension: Secondary | ICD-10-CM | POA: Diagnosis not present

## 2024-09-08 DIAGNOSIS — R0789 Other chest pain: Secondary | ICD-10-CM | POA: Diagnosis not present

## 2024-10-12 NOTE — Progress Notes (Signed)
 Meagan Cain                                          MRN: [id number]   10/12/2024   The [facility] Quality Team Specialist reviewed this patient medical record for the purposes of chart review for care gap closure. The following were reviewed: abstraction for care gap closure-controlling blood pressure.    [facility] Quality Team
